# Patient Record
Sex: Female | Born: 1955 | Race: White | Hispanic: No | Marital: Married | Smoking: Never smoker
Health system: Southern US, Community
[De-identification: ages and names within clinical notes are randomized; demographics above are authoritative.]

## PROBLEM LIST (undated history)

## (undated) DIAGNOSIS — C801 Malignant (primary) neoplasm, unspecified: Secondary | ICD-10-CM

## (undated) DIAGNOSIS — J45909 Unspecified asthma, uncomplicated: Secondary | ICD-10-CM

## (undated) HISTORY — PX: ABDOMINAL HYSTERECTOMY: SHX81

## (undated) HISTORY — PX: LUNG LOBECTOMY: SHX167

---

## 2008-02-27 DIAGNOSIS — C801 Malignant (primary) neoplasm, unspecified: Secondary | ICD-10-CM

## 2008-02-27 HISTORY — DX: Malignant (primary) neoplasm, unspecified: C80.1

## 2013-12-15 ENCOUNTER — Emergency Department: Payer: Self-pay | Admitting: Emergency Medicine

## 2015-06-19 ENCOUNTER — Ambulatory Visit
Admission: EM | Admit: 2015-06-19 | Discharge: 2015-06-19 | Disposition: A | Discharge status: Home / Self Care | Payer: BC Managed Care – PPO | Attending: Family Medicine | Admitting: Family Medicine

## 2015-06-19 ENCOUNTER — Encounter: Payer: Self-pay | Admitting: *Deleted

## 2015-06-19 DIAGNOSIS — H05011 Cellulitis of right orbit: Secondary | ICD-10-CM | POA: Diagnosis not present

## 2015-06-19 HISTORY — DX: Unspecified asthma, uncomplicated: J45.909

## 2015-06-19 MED ORDER — CEFUROXIME AXETIL 500 MG PO TABS: 500.0000 mg | ORAL_TABLET | Freq: Two times a day (BID) | ORAL | Status: DC

## 2015-06-19 NOTE — ED Notes (Signed)
Pt awoke this am with right eye pain. Denies injury.

## 2015-06-19 NOTE — Discharge Instructions (Signed)
Orbital Cellulitis Orbital cellulitis is an infection in the eye socket (orbit) and the tissues that surround the eye. The infection can spread to the eyelids, eyebrow area, and cheek. It can also cause a pocket of pus to develop around the eye (orbital abscess). In severe cases, the infection can spread to the brain. Orbital cellulitis is a medical emergency. CAUSES The most common cause of this condition is a bacterial infection. The infection usually spreads to the eye socket from another part of the body. The infection may start in:  The nose or sinuses.  The eyelids.  Facial skin.  The bloodstream. RISK FACTORS This condition is more likely to develop in people who have recently had one of the following:  Upper respiratory infection.  Sinus infection.  Eyelid or facial infection.  Eye injury.  Infection that affects the entire body or the bloodstream (systemic infection). SYMPTOMS Symptoms of this condition usually start quickly. Symptoms include:  Eye pain that gets worse with eye movement.  Swelling around the eye.  Eye redness.  Bulging of the eye.  Inability to move the eye.  Double vision.  Fever. DIAGNOSIS This condition may be diagnosed based on your symptoms and an eye exam. You may also have tests to confirm the diagnosis and to check for an orbital abscess. Other tests (cultures) may be done to find out what type of bacteria is causing the infection. Tests may include:  Complete blood count (CBC).  Blood culture.  Nose, sinus, or throat culture.  Imaging studies such as a CT scan or MRI. TREATMENT This condition is usually treated in a hospital. Antibiotic medicines are given directly into a vein through an IV tube.  At first, you may get IV antibiotics to kill bacteria that often cause orbital cellulitis (broad spectrum antibiotics).  Your medicine may be changed if cultures suggest that another antibiotic would be better.  If the IV  antibiotics are working to treat your infection, you may be switched to oral antibiotics and allowed to go home.  In some cases, surgery may be needed to drain an orbital abscess. HOME CARE INSTRUCTIONS  Take medicines only as directed by your health care provider.  Take your antibiotic medicine as directed by your health care provider. Finish the antibiotic even if you start to feel better.  Return to your normal activities as directed by your health care provider. Ask your health care provider what activities are safe for you.  Keep all follow-up visits as directed by your health care provider. This is important. SEEK IMMEDIATE MEDICAL CARE IF:  Your eye pain or swelling returns or it gets worse.  You have any changes in your vision.  You have a fever.   This information is not intended to replace advice given to you by your health care provider. Make sure you discuss any questions you have with your health care provider.   Document Released: 02/06/2001 Document Revised: 06/29/2014 Document Reviewed: 02/08/2014 Elsevier Interactive Patient Education 2016 [employer]  

## 2015-06-19 NOTE — ED Provider Notes (Signed)
CSN: [id number]     Arrival date & time 06/19/15  1216 History   First MD Initiated Contact with Patient 06/19/15 1408    Nurses notes were reviewed. Chief Complaint  Patient presents with  . Eye Pain  . Headache   Patient reports having pain over right eye. Pain the right eye started today. She states about 3:00 this morning still having pain in the right eye. Sensation unable to close her eyes closed which does close her eyes that hurts. The some discomfort with the right eye but hurts worse which does close her eyes. She reports some nasal congestion but not much. No sore throat. She states she's been on a lot of antibiotics since November the last one was for UTI which she finished about 3 weeks ago which was for Septra. She's not any chronic medications other than as medication and she uses Flexeril at night. Drug allergies she's allergic to penicillins. She's never smoked but she still developed lung cancer. Only family medical history this pertinent is that there is cancer in multiple members of the family. He denies any fever last night or today. Visual acuity was checked by the nurse earlier and found to be normal.    (Consider location/radiation/quality/duration/timing/severity/associated sxs/prior Treatment) Patient is a 60 y.o. female presenting with eye pain and headaches. No language interpreter was used.  Eye Pain This is a new problem. The current episode started 6 to 12 hours ago. Associated symptoms include headaches. Nothing aggravates the symptoms. Nothing relieves the symptoms. She has tried nothing for the symptoms. The treatment provided no relief.  Headache Associated symptoms: eye pain     Past Medical History  Diagnosis Date  . Asthma    Past Surgical History  Procedure Laterality Date  . Lung lobectomy Right    History reviewed. No pertinent family history. Social History  Substance Use Topics  . Smoking status: Never Smoker   . Smokeless tobacco: None  .  Alcohol Use: Yes   OB History    No data available     Review of Systems  Eyes: Positive for pain.  Neurological: Positive for headaches.    Allergies  Penicillins  Home Medications   Prior to Admission medications   Medication Sig Start Date End Date Taking? Authorizing Provider  cyclobenzaprine (FLEXERIL) 10 MG tablet Take 10 mg by mouth 3 (three) times daily as needed for muscle spasms.   Yes Historical Provider, MD  Fluticasone-Salmeterol (ADVAIR) 500-50 MCG/DOSE AEPB Inhale 1 puff into the lungs 2 (two) times daily.   Yes Historical Provider, MD  montelukast (SINGULAIR) 10 MG tablet Take 10 mg by mouth at bedtime.   Yes Historical Provider, MD  cefUROXime (CEFTIN) 500 MG tablet Take 1 tablet (500 mg total) by mouth 2 (two) times daily. 06/19/15   Frederich Cha, MD   Meds Ordered and Administered this Visit  Medications - No data to display  BP 146/81 mmHg  Pulse 88  Temp(Src) 98.1 F (36.7 C) (Oral)  Resp 16  Ht 5\' 4"  (1.626 m)  Wt 210 lb (95.255 kg)  BMI 36.03 kg/m2  SpO2 98% No data found.   Physical Exam  Constitutional: She is oriented to person, place, and time. She appears well-developed and well-nourished.  HENT:  Head: Normocephalic and atraumatic.  Right Ear: Hearing, tympanic membrane, external ear and ear canal normal.  Left Ear: Hearing, tympanic membrane, external ear and ear canal normal.  Nose: Mucosal edema present. No rhinorrhea. Right sinus exhibits maxillary sinus  tenderness. Right sinus exhibits no frontal sinus tenderness. Left sinus exhibits no maxillary sinus tenderness and no frontal sinus tenderness.  Mouth/Throat: Uvula is midline and mucous membranes are normal.  Eyes: Conjunctivae are normal. Pupils are equal, round, and reactive to light.    Neck: Normal range of motion. Neck supple.  Neurological: She is alert and oriented to person, place, and time. She has normal reflexes.  Skin: There is erythema.  Psychiatric: She has a normal  mood and affect. Her behavior is normal.  Vitals reviewed.   ED Course  Procedures (including critical care time)  Labs Review Labs Reviewed - No data to display  Imaging Review No results found.   Visual Acuity Review  Right Eye Distance:   Left Eye Distance:   Bilateral Distance:    Right Eye Near:   Left Eye Near:    Bilateral Near:         MDM   1. Cellulitis of right orbital region    We'll place patient on Ceftin 500 mg 1 tablet twice a day. I informed patient the best I can come with a diagnosis which tried to answer her questions about is she has some type of orbital cellulitis. It is somewhat atypical but the eye movement themselves and I exam appears to be normal. I have asked though that she sees her PCP in 48 hours if not having marked improvement. Also stressed to her that I gets worse before 48 hours to go on to the emergency room to be seen and evaluated this may have some type of imaging studies such as CT scan done.  Note: This dictation was prepared with Dragon dictation along with smaller phrase technology. Any transcriptional errors that result from this process are unintentional.     Frederich Cha, MD 06/19/15 1452

## 2016-08-30 ENCOUNTER — Other Ambulatory Visit: Payer: Self-pay | Admitting: Family Medicine

## 2016-08-30 DIAGNOSIS — Z1239 Encounter for other screening for malignant neoplasm of breast: Secondary | ICD-10-CM

## 2016-10-02 ENCOUNTER — Ambulatory Visit
Admission: RE | Admit: 2016-10-02 | Discharge: 2016-10-02 | Disposition: A | Discharge status: Home / Self Care | Payer: BC Managed Care – PPO | Source: Ambulatory Visit | Attending: Family Medicine | Admitting: Family Medicine

## 2016-10-02 ENCOUNTER — Encounter (INDEPENDENT_AMBULATORY_CARE_PROVIDER_SITE_OTHER): Payer: Self-pay

## 2016-10-02 DIAGNOSIS — Z1231 Encounter for screening mammogram for malignant neoplasm of breast: Secondary | ICD-10-CM | POA: Diagnosis not present

## 2016-10-02 DIAGNOSIS — Z1239 Encounter for other screening for malignant neoplasm of breast: Secondary | ICD-10-CM

## 2016-10-02 HISTORY — DX: Malignant (primary) neoplasm, unspecified: C80.1

## 2016-10-09 ENCOUNTER — Inpatient Hospital Stay
Admission: RE | Admit: 2016-10-09 | Discharge: 2016-10-09 | Disposition: A | Discharge status: Home / Self Care | Payer: Self-pay | Source: Ambulatory Visit | Attending: *Deleted | Admitting: *Deleted

## 2016-10-09 ENCOUNTER — Other Ambulatory Visit: Payer: Self-pay | Admitting: *Deleted

## 2016-10-09 DIAGNOSIS — Z9289 Personal history of other medical treatment: Secondary | ICD-10-CM

## 2018-04-04 ENCOUNTER — Emergency Department
Admission: EM | Admit: 2018-04-04 | Discharge: 2018-04-04 | Disposition: A | Discharge status: Home / Self Care | Payer: BC Managed Care – PPO | Attending: Emergency Medicine | Admitting: Emergency Medicine

## 2018-04-04 ENCOUNTER — Emergency Department: Payer: BC Managed Care – PPO

## 2018-04-04 ENCOUNTER — Other Ambulatory Visit: Payer: Self-pay

## 2018-04-04 DIAGNOSIS — Z85118 Personal history of other malignant neoplasm of bronchus and lung: Secondary | ICD-10-CM | POA: Insufficient documentation

## 2018-04-04 DIAGNOSIS — Z79899 Other long term (current) drug therapy: Secondary | ICD-10-CM | POA: Diagnosis not present

## 2018-04-04 DIAGNOSIS — R509 Fever, unspecified: Secondary | ICD-10-CM | POA: Diagnosis present

## 2018-04-04 DIAGNOSIS — J189 Pneumonia, unspecified organism: Secondary | ICD-10-CM

## 2018-04-04 DIAGNOSIS — J101 Influenza due to other identified influenza virus with other respiratory manifestations: Secondary | ICD-10-CM | POA: Insufficient documentation

## 2018-04-04 DIAGNOSIS — J45909 Unspecified asthma, uncomplicated: Secondary | ICD-10-CM | POA: Insufficient documentation

## 2018-04-04 DIAGNOSIS — R2242 Localized swelling, mass and lump, left lower limb: Secondary | ICD-10-CM | POA: Diagnosis not present

## 2018-04-04 LAB — CBC
HCT: 46.1 % — ABNORMAL HIGH (ref 36.0–46.0)
HEMOGLOBIN: 14.8 g/dL (ref 12.0–15.0)
MCH: 28.8 pg (ref 26.0–34.0)
MCHC: 32.1 g/dL (ref 30.0–36.0)
MCV: 89.7 fL (ref 80.0–100.0)
Platelets: 201 10*3/uL (ref 150–400)
RBC: 5.14 MIL/uL — ABNORMAL HIGH (ref 3.87–5.11)
RDW: 13.1 % (ref 11.5–15.5)
WBC: 14.9 10*3/uL — ABNORMAL HIGH (ref 4.0–10.5)
nRBC: 0 % (ref 0.0–0.2)

## 2018-04-04 LAB — BASIC METABOLIC PANEL
Anion gap: 8 (ref 5–15)
BUN: 21 mg/dL (ref 8–23)
CALCIUM: 9 mg/dL (ref 8.9–10.3)
CHLORIDE: 104 mmol/L (ref 98–111)
CO2: 24 mmol/L (ref 22–32)
Creatinine, Ser: 0.57 mg/dL (ref 0.44–1.00)
GFR calc Af Amer: >60 mL/min (ref >60)
Glucose, Bld: 121 mg/dL — ABNORMAL HIGH (ref 70–99)
Potassium: 4.1 mmol/L (ref 3.5–5.1)
SODIUM: 136 mmol/L (ref 135–145)

## 2018-04-04 LAB — URINALYSIS, ROUTINE W REFLEX MICROSCOPIC
Bacteria, UA: NONE SEEN
Bilirubin Urine: NEGATIVE
Glucose, UA: NEGATIVE mg/dL
Hgb urine dipstick: NEGATIVE
Ketones, ur: NEGATIVE mg/dL
Nitrite: NEGATIVE
Protein, ur: NEGATIVE mg/dL
SPECIFIC GRAVITY, URINE: 1.019 (ref 1.005–1.030)
pH: 5 (ref 5.0–8.0)

## 2018-04-04 LAB — LACTIC ACID, PLASMA: Lactic Acid, Venous: 1.3 mmol/L (ref 0.5–1.9)

## 2018-04-04 LAB — INFLUENZA PANEL BY PCR (TYPE A & B)
INFLAPCR: POSITIVE — AB
INFLBPCR: NEGATIVE

## 2018-04-04 MED ORDER — ACETAMINOPHEN 325 MG PO TABS
650.0000 mg | ORAL_TABLET | Freq: Once | ORAL | Status: AC | PRN
  Administered 2018-04-04: 650 mg via ORAL
  Filled 2018-04-04: qty 2

## 2018-04-04 MED ORDER — LEVOFLOXACIN IN D5W 750 MG/150ML IV SOLN
750.0000 mg | Freq: Once | INTRAVENOUS | Status: AC
  Administered 2018-04-04: 750 mg via INTRAVENOUS
  Filled 2018-04-04: qty 150

## 2018-04-04 MED ORDER — OSELTAMIVIR PHOSPHATE 75 MG PO CAPS: 75.0000 mg | ORAL_CAPSULE | Freq: Two times a day (BID) | ORAL | 0 refills | Status: AC

## 2018-04-04 MED ORDER — LEVOFLOXACIN 750 MG PO TABS: 750.0000 mg | ORAL_TABLET | Freq: Every day | ORAL | 0 refills | Status: AC

## 2018-04-04 NOTE — ED Notes (Signed)
Given mask

## 2018-04-04 NOTE — ED Triage Notes (Signed)
C/o fever, cough that began last night. Reports productive cough. Body aches. No OTC medications for fever today.

## 2018-04-04 NOTE — ED Provider Notes (Signed)
[facility] Emergency Department Provider Note  ____________________________________________   First MD Initiated Contact with Patient 04/04/18 1958     (approximate)  I have reviewed the triage vital signs and the nursing notes.   HISTORY  Chief Complaint Fever and Cough   HPI Lindsay Wiggins is a 63 y.o. female with a history of lung cancer status post right sided lung resection who is presented emergency department with 24 hours of cough as well as fever.  Also complaining of generalized weakness/malaise.  Denies body aches.  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  Denies any shortness of breath or chest pain.  Also complaining of left lower extremity swelling.  Says that she injured this knee about 2 months ago in her Zumba class and had injection done several days ago.  Says that the pain is now worse in the left knee and she has edema about the left ankle which is not normal for her.   Past Medical History:  Diagnosis Date  . Asthma   . Cancer ([place]) 2010   lung ca- most of rt lung resected    There are no active problems to display for this patient.   Past Surgical History:  Procedure Laterality Date  . ABDOMINAL HYSTERECTOMY    . LUNG LOBECTOMY Right     Prior to Admission medications   Medication Sig Start Date End Date Taking? Authorizing Provider  cefUROXime (CEFTIN) 500 MG tablet Take 1 tablet (500 mg total) by mouth 2 (two) times daily. 06/19/15   Frederich Cha, MD  cyclobenzaprine (FLEXERIL) 10 MG tablet Take 10 mg by mouth 3 (three) times daily as needed for muscle spasms.    [provider]  Fluticasone-Salmeterol (ADVAIR) 500-50 MCG/DOSE AEPB Inhale 1 puff into the lungs 2 (two) times daily.    [provider]  montelukast (SINGULAIR) 10 MG tablet Take 10 mg by mouth at bedtime.    [provider]     Allergies Penicillins  Family History  Problem Relation Age of Onset  . Breast cancer Neg Hx     Social History Social History   Tobacco Use  . Smoking status: Never Smoker  Substance Use Topics  . Alcohol use: Yes  . Drug use: Not on file    Review of Systems  Constitutional: As above Eyes: No visual changes. ENT: No sore throat.  Denies runny nose. Cardiovascular: Denies chest pain. Respiratory: As above Gastrointestinal: No abdominal pain.  No nausea, no vomiting.  No diarrhea.  No constipation. Genitourinary: Negative for dysuria. Musculoskeletal: Negative for back pain. Skin: Negative for rash. Neurological: Negative for headaches, focal weakness or numbness.   ____________________________________________   PHYSICAL EXAM:  VITAL SIGNS: ED Triage Vitals [04/04/18 1719]  Enc Vitals Group     BP 139/70     Pulse Rate (!) 116     Resp 18     Temp (!) 102.6 F (39.2 C)     Temp Source Oral     SpO2 94 %     Weight 208 lb (94.3 kg)     Height 5\' 4"  (1.626 m)     Head Circumference      Peak Flow      Pain Score 2     Pain Loc      Pain Edu?      Excl. in [place]?     Constitutional: Alert and  oriented. Well appearing and in no acute distress. Eyes: Conjunctivae are normal.  Head: Atraumatic. Nose: No congestion/rhinnorhea. Mouth/Throat: Mucous membranes are moist.  Neck: No stridor.   Cardiovascular: Normal rate, regular rhythm. Grossly normal heart sounds.  Respiratory: Normal respiratory effort.  No retractions.  Mild rales to left lower field. Gastrointestinal: Soft and nontender. No distention. No CVA tenderness. Musculoskeletal: Left lower extremity with edema from the left ankle to left mid calf.  No effusion to left knee.  No erythema or fluctuance.  No ligamentous laxity.   Neurologic:  Normal speech and language. No gross focal neurologic deficits are appreciated. Skin:  Skin is warm, dry and intact. No rash noted. Psychiatric: Mood and  affect are normal. Speech and behavior are normal.  ____________________________________________   LABS (all labs ordered are listed, but only abnormal results are displayed)  Labs Reviewed  CBC - Abnormal; Notable for the following components:      Result Value   WBC 14.9 (*)    RBC 5.14 (*)    HCT 46.1 (*)    All other components within normal limits  BASIC METABOLIC PANEL - Abnormal; Notable for the following components:   Glucose, Bld 121 (*)    All other components within normal limits  URINALYSIS, ROUTINE W REFLEX MICROSCOPIC - Abnormal; Notable for the following components:   Color, Urine YELLOW (*)    APPearance CLEAR (*)    Leukocytes, UA SMALL (*)    All other components within normal limits  INFLUENZA PANEL BY PCR (TYPE A & B) - Abnormal; Notable for the following components:   Influenza A By PCR POSITIVE (*)    All other components within normal limits  CULTURE, BLOOD (ROUTINE X 2)  CULTURE, BLOOD (ROUTINE X 2)  LACTIC ACID, PLASMA   ____________________________________________  EKG  ED ECG REPORT I, Doran Stabler, the attending physician, personally viewed and interpreted this ECG.   Date: 04/04/2018  EKG Time: 2020  Rate: 102  Rhythm: sinus tachycardia  Axis: Normal  Intervals:none  ST&T Change: No ST segment ovation or depression.  No abnormal T wave inversion.  ____________________________________________  RADIOLOGY  Left lower lobe atalectasis vs pneumonia.  Negative DVT ultrasound of the left lower extremity. ____________________________________________   PROCEDURES  Procedure(s) performed:   Procedures  Critical Care performed:   ____________________________________________   INITIAL IMPRESSION / ASSESSMENT AND PLAN / ED COURSE  Pertinent labs & imaging results that were available during my care of the patient were reviewed by me and considered in my medical decision making (see chart for details).  Differential includes, but  is not limited to, viral syndrome, bronchitis including COPD exacerbation, pneumonia, reactive airway disease including asthma, CHF including exacerbation with or without pulmonary/interstitial edema, pneumothorax, ACS, thoracic trauma, and pulmonary embolism. As part of my medical decision making, I reviewed the following data within the electronic MEDICAL RECORD NUMBER Notes from prior ED visits  ----------------------------------------- 10:43 PM on 04/04/2018 -----------------------------------------  Patient flu a positive.  Possible left lower lobe pneumonia.  Given a dose of Levaquin in the emergency department.  I discussed Tamiflu with the patient and she would like to defer as she thinks that the side effects may outweigh the benefits.  Has the evidence is mixed with Tamiflu we had a discussion about the benefits versus the side effects.  The patient says that she will except prescription for Tamiflu and depending on how she feels tomorrow may start the medication but knows to start it within 48  hours of symptom onset.  Says that the symptoms started at 2 AM this morning.  Also knows to return to the hospital for any worsening or concerning symptoms.  She will be discharged home with Levaquin.  Normal lactic acid.  Afebrile at this time with a heart rate of 94.  Benign appearing.  We also discussed that the most serious complications from flu may be overlying pneumonia and that people do die from this.  She says that she would not like to stay in the hospital and that she return for any worsening concerning symptoms.  Patient not altered at this time.  Not clinically intoxicated.  Has capacity to make medical decisions.  Will be discharged home. ____________________________________________   FINAL CLINICAL IMPRESSION(S) / ED DIAGNOSES  Influenza A.  Community-acquired pneumonia.  NEW MEDICATIONS STARTED DURING THIS VISIT:  New Prescriptions   No medications on file     Note:  This document  was prepared using Dragon voice recognition software and may include unintentional dictation errors.     Orbie Pyo, MD 04/04/18 2245

## 2018-04-04 NOTE — Progress Notes (Signed)
CODE SEPSIS - PHARMACY COMMUNICATION  **Broad Spectrum Antibiotics should be administered within 1 hour of Sepsis diagnosis**  Time Code Sepsis Called/Page Received: 2005  Antibiotics Ordered: none yet- suspected sepsis order  Time of 1st antibiotic administration:    Additional action taken by pharmacy: Called RN Terri Piedra- she will ask MD what the plan is for abx  If necessary, Name of Provider/Nurse Contacted: Veverly Fells ,PharmD Clinical Pharmacist  04/04/2018  9:03 PM

## 2018-04-04 NOTE — ED Notes (Signed)
Patient transported to Ultrasound 

## 2018-04-04 NOTE — Progress Notes (Addendum)
CODE SEPSIS - PHARMACY COMMUNICATION  **Broad Spectrum Antibiotics should be administered within 1 hour of Sepsis diagnosis**  Time Code Sepsis Called/Page Received: 2005  Antibiotics Ordered: Levaquin now order -see previous note about code sepsis  Time of 1st antibiotic administration:  @ 2110  Additional action taken by pharmacy: [place]- she will ask MD what the plan is for abx  If necessary, Name of Provider/Nurse Contacted: Glennie Isle, PharmD, BCPS Clinical Pharmacist 04/04/2018 9:31 PM

## 2018-04-09 LAB — CULTURE, BLOOD (ROUTINE X 2)
Culture: NO GROWTH
Culture: NO GROWTH

## 2019-05-02 ENCOUNTER — Ambulatory Visit: Payer: BC Managed Care – PPO

## 2019-05-02 ENCOUNTER — Ambulatory Visit: Payer: BC Managed Care – PPO | Attending: Internal Medicine

## 2019-05-02 DIAGNOSIS — Z23 Encounter for immunization: Secondary | ICD-10-CM

## 2019-05-02 NOTE — Progress Notes (Signed)
   Covid-19 Vaccination Clinic  Name:  Lindsay Wiggins    MRN: [id number] DOB: 24-Dec-1955  05/02/2019  [name] was observed post Covid-19 immunization for 30 minutes based on pre-vaccination screening without incident. She was provided with Vaccine Information Sheet and instruction to access the V-Safe system.   [name] was instructed to call 911 with any severe reactions post vaccine: Marland Kitchen Difficulty breathing  . Swelling of face and throat  . A fast heartbeat  . A bad rash all over body  . Dizziness and weakness   Immunizations Administered    Name Date Dose VIS Date Route   Pfizer COVID-19 Vaccine 05/02/2019 12:18 PM 0.3 mL 02/06/2019 Intramuscular   Manufacturer: [place]   Lot: [id number]   [place]: [id number]

## 2019-05-09 ENCOUNTER — Other Ambulatory Visit: Payer: Self-pay

## 2019-05-09 ENCOUNTER — Ambulatory Visit
Admission: EM | Admit: 2019-05-09 | Discharge: 2019-05-09 | Disposition: A | Discharge status: Home / Self Care | Payer: BC Managed Care – PPO | Attending: Family Medicine | Admitting: Family Medicine

## 2019-05-09 ENCOUNTER — Encounter: Payer: Self-pay | Admitting: Emergency Medicine

## 2019-05-09 DIAGNOSIS — N390 Urinary tract infection, site not specified: Secondary | ICD-10-CM | POA: Diagnosis not present

## 2019-05-09 LAB — URINALYSIS, COMPLETE (UACMP) WITH MICROSCOPIC
Bacteria, UA: NONE SEEN
Bilirubin Urine: NEGATIVE
Glucose, UA: NEGATIVE mg/dL
Hgb urine dipstick: NEGATIVE
Leukocytes,Ua: NEGATIVE
Nitrite: POSITIVE — AB
Protein, ur: NEGATIVE mg/dL
Specific Gravity, Urine: 1.025 (ref 1.005–1.030)
WBC, UA: NONE SEEN WBC/hpf (ref 0–5)
pH: 6 (ref 5.0–8.0)

## 2019-05-09 MED ORDER — SULFAMETHOXAZOLE-TRIMETHOPRIM 800-160 MG PO TABS: 1.0000 | ORAL_TABLET | Freq: Two times a day (BID) | ORAL | 0 refills | Status: DC

## 2019-05-09 NOTE — Discharge Instructions (Signed)
Increase water intake

## 2019-05-09 NOTE — ED Triage Notes (Signed)
Patient c/o mid back pain, burning when urinating and urinary urgency that started Wed.  Patient denies fevers.

## 2019-05-09 NOTE — ED Provider Notes (Signed)
[facility]    CSN: [id number] Arrival date & time: 05/09/19  1304      History   Chief Complaint Chief Complaint  Patient presents with  . Back Pain  . Dysuria    HPI Lindsay Wiggins is a 64 y.o. female.   64 yo female with a c/o burning with urination, frequency and urgency of urination for the past 4 days. Denies any fevers, chills, nausea, vomiting in the last 3 days.    Back Pain Associated symptoms: dysuria   Dysuria   Past Medical History:  Diagnosis Date  . Asthma   . Cancer ([place]) 2010   lung ca- most of rt lung resected    There are no problems to display for this patient.   Past Surgical History:  Procedure Laterality Date  . ABDOMINAL HYSTERECTOMY    . LUNG LOBECTOMY Right     OB History   No obstetric history on file.      Home Medications    Prior to Admission medications   Medication Sig Start Date End Date Taking? Authorizing Provider  cyclobenzaprine (FLEXERIL) 10 MG tablet Take 10 mg by mouth 3 (three) times daily as needed for muscle spasms.   Yes [provider]  Fluticasone-Salmeterol (ADVAIR) 500-50 MCG/DOSE AEPB Inhale 1 puff into the lungs 2 (two) times daily.   Yes [provider]  montelukast (SINGULAIR) 10 MG tablet Take 10 mg by mouth at bedtime.   Yes [provider]  cefUROXime (CEFTIN) 500 MG tablet Take 1 tablet (500 mg total) by mouth 2 (two) times daily. 06/19/15   Frederich Cha, MD  sulfamethoxazole-trimethoprim (BACTRIM DS) 800-160 MG tablet Take 1 tablet by mouth 2 (two) times daily. 05/09/19   Norval Gable, MD    Family History Family History  Problem Relation Age of Onset  . Breast cancer Neg Hx     Social History Social History   Tobacco Use  . Smoking status: Never Smoker  . Smokeless tobacco: Never Used  Substance Use Topics  . Alcohol use: Yes  . Drug use: Not on file     Allergies   Penicillins   Review of Systems Review of Systems  Genitourinary:  Positive for dysuria.  Musculoskeletal: Positive for back pain.     Physical Exam Triage Vital Signs ED Triage Vitals  Enc Vitals Group     BP 05/09/19 1326 133/72     Pulse Rate 05/09/19 1326 88     Resp 05/09/19 1326 16     Temp 05/09/19 1326 97.8 F (36.6 C)     Temp Source 05/09/19 1326 Oral     SpO2 05/09/19 1326 98 %     Weight 05/09/19 1321 210 lb (95.3 kg)     Height 05/09/19 1321 5\' 4"  (1.626 m)     Head Circumference --      Peak Flow --      Pain Score 05/09/19 1321 7     Pain Loc --      Pain Edu? --      Excl. in [place]? --    No data found.  Updated Vital Signs BP 133/72 (BP Location: Right Arm)   Pulse 88   Temp 97.8 F (36.6 C) (Oral)   Resp 16   Ht 5\' 4"  (1.626 m)   Wt 95.3 kg   SpO2 98%   BMI 36.05 kg/m   Visual Acuity Right Eye Distance:   Left Eye Distance:   Bilateral Distance:  Right Eye Near:   Left Eye Near:    Bilateral Near:     Physical Exam Vitals and nursing note reviewed.  Constitutional:      General: She is not in acute distress.    Appearance: She is not toxic-appearing or diaphoretic.  Cardiovascular:     Rate and Rhythm: Normal rate.  Pulmonary:     Effort: Pulmonary effort is normal. No respiratory distress.  Abdominal:     General: There is no distension.     Palpations: Abdomen is soft.     Tenderness: There is no right CVA tenderness or left CVA tenderness.  Neurological:     Mental Status: She is alert.      UC Treatments / Results  Labs (all labs ordered are listed, but only abnormal results are displayed) Labs Reviewed  URINALYSIS, COMPLETE (UACMP) WITH MICROSCOPIC - Abnormal; Notable for the following components:      Result Value   Ketones, ur TRACE (*)    Nitrite POSITIVE (*)    All other components within normal limits  URINE CULTURE    EKG   Radiology No results found.  Procedures Procedures (including critical care time)  Medications Ordered in UC Medications - No data to  display  Initial Impression / Assessment and Plan / UC Course  I have reviewed the triage vital signs and the nursing notes.  Pertinent labs & imaging results that were available during my care of the patient were reviewed by me and considered in my medical decision making (see chart for details).      Final Clinical Impressions(s) / UC Diagnoses   Final diagnoses:  Lower urinary tract infectious disease     Discharge Instructions     Increase water intake    ED Prescriptions    Medication Sig Dispense Auth. Provider   sulfamethoxazole-trimethoprim (BACTRIM DS) 800-160 MG tablet Take 1 tablet by mouth 2 (two) times daily. 10 tablet Norval Gable, MD      1. Lab results and diagnosis reviewed with patient 2. rx as per orders above; reviewed possible side effects, interactions, risks and benefits  3. Recommend supportive treatment as above 4. Follow-up prn if symptoms worsen or don't improve   PDMP not reviewed this encounter.   Norval Gable, MD 05/09/19 1419

## 2019-05-11 LAB — URINE CULTURE

## 2019-05-23 ENCOUNTER — Ambulatory Visit: Payer: BC Managed Care – PPO | Attending: Internal Medicine

## 2019-05-23 DIAGNOSIS — Z23 Encounter for immunization: Secondary | ICD-10-CM

## 2019-05-23 NOTE — Progress Notes (Signed)
   Covid-19 Vaccination Clinic   Mrs Ahlgrim rec'd her 2nd dose of the COVID vaccine and called RN over during her observation period d/t feeling dizzy at 1228.  [place] to pt. She is dizzy, c/o feeling hot, but extremities are cool, she is pale. Fan started. 96% RA, RR 18, P 87, 141/73 (pt and husband report this is high for her). She brought water with her; encouraged her to keep drinking. She denies hx of DM or HTN, but she does have a hx of asthma. She ate breakfast but has not eaten lunch. Offered crackers, but she does not eat gluten, and we have no gluten-free snacks. She reports she felt "bad" for a few weeks after her first dose and only started feeling well a few days ago.  1238 Mrs Boot reports feeling back to baseline. 98% on RA, 103/69 (normal for pt per pt & husband), P 85, RR16. P 85. She feels she will be ready to leave when her time is up, but she does not feel steady enough to give up the fan.  1250 Observation period up. Patient is ready to leave and feels comfortable and back to baseline. Left on foot with husband.  Marjie Skiff Rosaly Labarbera, RN, BSN, [facility]  05/23/2019 1:12 PM

## 2020-02-08 ENCOUNTER — Other Ambulatory Visit: Payer: Self-pay | Admitting: Family Medicine

## 2020-02-08 DIAGNOSIS — Z1231 Encounter for screening mammogram for malignant neoplasm of breast: Secondary | ICD-10-CM

## 2020-03-15 ENCOUNTER — Other Ambulatory Visit: Payer: Self-pay

## 2020-03-15 ENCOUNTER — Ambulatory Visit
Admission: RE | Admit: 2020-03-15 | Discharge: 2020-03-15 | Disposition: A | Discharge status: Home / Self Care | Payer: Medicare PPO | Source: Ambulatory Visit | Attending: Family Medicine | Admitting: Family Medicine

## 2020-03-15 DIAGNOSIS — Z1231 Encounter for screening mammogram for malignant neoplasm of breast: Secondary | ICD-10-CM | POA: Diagnosis present

## 2020-03-16 LAB — EXTERNAL GENERIC LAB PROCEDURE: COLOGUARD: NEGATIVE

## 2020-03-16 LAB — COLOGUARD: COLOGUARD: NEGATIVE

## 2020-05-02 ENCOUNTER — Ambulatory Visit
Admission: EM | Admit: 2020-05-02 | Discharge: 2020-05-02 | Disposition: A | Discharge status: Home / Self Care | Payer: Medicare PPO

## 2020-05-02 ENCOUNTER — Other Ambulatory Visit: Payer: Self-pay

## 2020-05-02 DIAGNOSIS — J4521 Mild intermittent asthma with (acute) exacerbation: Secondary | ICD-10-CM | POA: Diagnosis not present

## 2020-05-02 MED ORDER — PREDNISONE 20 MG PO TABS: 60.0000 mg | ORAL_TABLET | Freq: Every day | ORAL | 0 refills | Status: AC

## 2020-05-02 MED ORDER — AEROCHAMBER MV MISC: 2 refills | Status: AC

## 2020-05-02 NOTE — ED Provider Notes (Signed)
[facility]    CSN: [id number] Arrival date & time: 05/02/20  0906      History   Chief Complaint Chief Complaint  Patient presents with  . Shortness of Breath    HPI Lindsay Wiggins is a 65 y.o. female.   HPI   65 year old female here for evaluation of shortness of breath.  Patient reports that her shortness of breath started around 4 AM this morning.  She used both her inhaler, without a spacer, and then switch to her nebulizer when she was not getting any relief from her shortness of breath.  Patient states that she has had an intermittent cough that is occasionally productive for a white sputum.  Patient denies fever, wheezing, runny nose, nasal congestion, or GI complaints.  Patient has not been around anybody who has been sick recently.  Patient does have a history of lung cancer and has had a partial pneumonectomy on the lower right.  Past Medical History:  Diagnosis Date  . Asthma   . Cancer ([place]) 2010   lung ca- most of rt lung resected    There are no problems to display for this patient.   Past Surgical History:  Procedure Laterality Date  . ABDOMINAL HYSTERECTOMY    . LUNG LOBECTOMY Right     OB History   No obstetric history on file.      Home Medications    Prior to Admission medications   Medication Sig Start Date End Date Taking? Authorizing Provider  cyclobenzaprine (FLEXERIL) 10 MG tablet Take 10 mg by mouth 3 (three) times daily as needed for muscle spasms.   Yes [provider]  Fluticasone-Salmeterol (WIXELA INHUB) 500-50 MCG/DOSE AEPB Inhale 1 puff into the lungs 2 (two) times daily.   Yes [provider]  montelukast (SINGULAIR) 10 MG tablet Take 10 mg by mouth at bedtime.   Yes [provider]  predniSONE (DELTASONE) 20 MG tablet Take 3 tablets (60 mg total) by mouth daily with breakfast for 5 days. 3 tablets by mouth daily for 5 days. 05/02/20 05/07/20 Yes Margarette Canada, NP  Spacer/Aero-Holding Josiah Lobo  (AEROCHAMBER MV) inhaler Use as instructed 05/02/20  Yes Margarette Canada, NP  albuterol (VENTOLIN HFA) 108 (90 Base) MCG/ACT inhaler SMARTSIG:2 Inhalation Via Inhaler Every 4 Hours PRN 03/22/20   [provider]    Family History Family History  Problem Relation Age of Onset  . Breast cancer Neg Hx     Social History Social History   Tobacco Use  . Smoking status: Never Smoker  . Smokeless tobacco: Never Used  Vaping Use  . Vaping Use: Never used  Substance Use Topics  . Alcohol use: Yes     Allergies   Penicillins   Review of Systems Review of Systems  Constitutional: Negative for activity change, appetite change and fever.  HENT: Negative for congestion and rhinorrhea.   Respiratory: Positive for cough and shortness of breath.   Gastrointestinal: Negative for diarrhea, nausea and vomiting.  Musculoskeletal: Negative for arthralgias and myalgias.  Hematological: Negative.   Psychiatric/Behavioral: Negative.      Physical Exam Triage Vital Signs ED Triage Vitals [05/02/20 0928]  Enc Vitals Group     BP      Pulse      Resp      Temp      Temp src      SpO2      Weight      Height      Head Circumference  Peak Flow      Pain Score 0     Pain Loc      Pain Edu?      Excl. in [place]?    No data found.  Updated Vital Signs BP (!) 131/51 (BP Location: Left Arm)   Pulse 92   Temp 98.3 F (36.8 C) (Oral)   Resp (!) 21   Ht 5\' 3"  (1.6 m)   Wt 240 lb (108.9 kg)   SpO2 100%   BMI 42.51 kg/m   Visual Acuity Right Eye Distance:   Left Eye Distance:   Bilateral Distance:    Right Eye Near:   Left Eye Near:    Bilateral Near:     Physical Exam Vitals and nursing note reviewed.  Constitutional:      General: She is not in acute distress.    Appearance: She is well-developed. She is obese. She is not ill-appearing.  HENT:     Head: Normocephalic and atraumatic.     Mouth/Throat:     Mouth: Mucous membranes are moist.  Eyes:     Extraocular  Movements: Extraocular movements intact.     Pupils: Pupils are equal, round, and reactive to light.  Cardiovascular:     Rate and Rhythm: Normal rate.     Heart sounds: Normal heart sounds. No murmur heard. No friction rub.  Pulmonary:     Effort: Pulmonary effort is normal. No tachypnea or respiratory distress.     Breath sounds: No stridor. Examination of the right-upper field reveals decreased breath sounds. Examination of the right-middle field reveals decreased breath sounds. Examination of the left-middle field reveals decreased breath sounds. Examination of the left-lower field reveals decreased breath sounds. Decreased breath sounds present. No wheezing, rhonchi or rales.  Skin:    General: Skin is warm and dry.     Capillary Refill: Capillary refill takes less than 2 seconds.     Findings: No erythema.  Neurological:     General: No focal deficit present.     Mental Status: She is alert and oriented to person, place, and time.  Psychiatric:        Mood and Affect: Mood normal.        Behavior: Behavior normal.      UC Treatments / Results  Labs (all labs ordered are listed, but only abnormal results are displayed) Labs Reviewed - No data to display  EKG   Radiology No results found.  Procedures Procedures (including critical care time)  Medications Ordered in UC Medications - No data to display  Initial Impression / Assessment and Plan / UC Course  I have reviewed the triage vital signs and the nursing notes.  Pertinent labs & imaging results that were available during my care of the patient were reviewed by me and considered in my medical decision making (see chart for details).   Patient is a very pleasant 65 year old female with a history of lung cancer and asthma that presents for evaluation of shortness of breath.  Patient reports that her shortness of breath started at 4 AM and she treated at home with her inhaler and also with nebulizer.  Patient reports  that those medications did not help her symptoms.  She became concerned because her home pulse ox was reading her oxygen level in the low 90s.  Being at 100% here in clinic and has a respiratory rate of 21 documented.  Patient is in no acute distress on physical exam.  Upper respiratory tree  is benign.  Patient does have mildly decreased lung sounds diffusely without rales or rhonchi.  No wheezes noted.  Patient's physical exam is consistent with an asthma flare.  We will treat patient with scheduled around-the-clock nebulizer treatments and place patient on a 5-day burst dose of prednisone 60 mg.  Patient verbalizes an understanding of same.  ER and return precautions reviewed.   Final Clinical Impressions(s) / UC Diagnoses   Final diagnoses:  Mild intermittent asthma with exacerbation     Discharge Instructions     Use your albuterol nebulizer, or your inhaler with your spacer, every 4-6 hours for the next 24 to 48 hours to help break the airway constriction.  Continue your Wixela as prescribed.  Take the prednisone 60 mg daily with breakfast for the next 5 days to help with inflammation.  If you have a worsening of your shortness of breath, unable to catch her breath, or unable to speak in full sentences, or is a late sign your lip start turning blue you need to go to the ER for evaluation.    ED Prescriptions    Medication Sig Dispense Auth. Provider   predniSONE (DELTASONE) 20 MG tablet Take 3 tablets (60 mg total) by mouth daily with breakfast for 5 days. 3 tablets by mouth daily for 5 days. 15 tablet Margarette Canada, NP   Spacer/Aero-Holding Josiah Lobo (AEROCHAMBER MV) inhaler Use as instructed 1 each Margarette Canada, NP     PDMP not reviewed this encounter.   Margarette Canada, NP 05/02/20 [phone]

## 2020-05-02 NOTE — ED Triage Notes (Signed)
Patient states that she has been having shortness of breath that started around 4 am this morning. States that she is an asthmatic and lung cancer survivor with a history of partial right lung removal. States that this woke her up this morning and 02 sats in low 90's, states that she did use her inhalers and nebulizer.

## 2020-05-02 NOTE — Discharge Instructions (Signed)
Use your albuterol nebulizer, or your inhaler with your spacer, every 4-6 hours for the next 24 to 48 hours to help break the airway constriction.  Continue your Wixela as prescribed.  Take the prednisone 60 mg daily with breakfast for the next 5 days to help with inflammation.  If you have a worsening of your shortness of breath, unable to catch her breath, or unable to speak in full sentences, or is a late sign your lip start turning blue you need to go to the ER for evaluation.

## 2020-06-11 ENCOUNTER — Other Ambulatory Visit: Payer: Self-pay

## 2020-06-11 ENCOUNTER — Emergency Department: Payer: Medicare PPO

## 2020-06-11 ENCOUNTER — Observation Stay
Admission: EM | Admit: 2020-06-11 | Discharge: 2020-06-13 | Disposition: A | Discharge status: Home / Self Care | Payer: Medicare PPO | Attending: Student | Admitting: Student

## 2020-06-11 DIAGNOSIS — J45909 Unspecified asthma, uncomplicated: Secondary | ICD-10-CM | POA: Diagnosis present

## 2020-06-11 DIAGNOSIS — E66813 Obesity, class 3: Secondary | ICD-10-CM | POA: Diagnosis present

## 2020-06-11 DIAGNOSIS — J45901 Unspecified asthma with (acute) exacerbation: Secondary | ICD-10-CM | POA: Diagnosis present

## 2020-06-11 DIAGNOSIS — Z20822 Contact with and (suspected) exposure to covid-19: Secondary | ICD-10-CM | POA: Diagnosis not present

## 2020-06-11 DIAGNOSIS — J4541 Moderate persistent asthma with (acute) exacerbation: Secondary | ICD-10-CM | POA: Diagnosis not present

## 2020-06-11 DIAGNOSIS — R0902 Hypoxemia: Secondary | ICD-10-CM | POA: Diagnosis not present

## 2020-06-11 DIAGNOSIS — R0602 Shortness of breath: Secondary | ICD-10-CM | POA: Diagnosis present

## 2020-06-11 DIAGNOSIS — Z85118 Personal history of other malignant neoplasm of bronchus and lung: Secondary | ICD-10-CM | POA: Diagnosis not present

## 2020-06-11 DIAGNOSIS — R030 Elevated blood-pressure reading, without diagnosis of hypertension: Secondary | ICD-10-CM | POA: Diagnosis present

## 2020-06-11 MED ORDER — METHYLPREDNISOLONE SODIUM SUCC 125 MG IJ SOLR
125.0000 mg | Freq: Once | INTRAMUSCULAR | Status: AC
  Administered 2020-06-12: 125 mg via INTRAVENOUS
  Filled 2020-06-11: qty 2

## 2020-06-11 MED ORDER — IPRATROPIUM-ALBUTEROL 0.5-2.5 (3) MG/3ML IN SOLN
3.0000 mL | Freq: Once | RESPIRATORY_TRACT | Status: AC
  Administered 2020-06-11: 3 mL via RESPIRATORY_TRACT
  Filled 2020-06-11: qty 3

## 2020-06-11 NOTE — ED Triage Notes (Signed)
Pt presents to ER c/o SOB since getting off prednisone taper on Wednesday.  Pt was being treated for possible asthma flare.  Pt tested negative for COVID 4 times recently.  Pt appears winded in triage.  Pt denies fevers and cough.  Pt has hx of asthma and lung ca with partial resection of right lower lung lobe.

## 2020-06-11 NOTE — ED Provider Notes (Signed)
[facility] Emergency Department Provider Note   ____________________________________________   Event Date/Time   First MD Initiated Contact with Patient 06/11/20 2305     (approximate)  I have reviewed the triage vital signs and the nursing notes.   HISTORY  Chief Complaint Shortness of Breath    HPI Lindsay Wiggins is a 65 y.o. female who presents to the ED from home with a chief complaint of shortness of breath.  Patient has a history of lung cancer status post lung resection, asthma who has been short of breath for the past 6 weeks.  Has been treated by her pulmonologist for possible asthma flare.  Recently discontinued prednisone taper 3 days ago.  States she felt fine on prednisone and decided to cancel a CT chest which was ordered for her since she was feeling good.  Since discontinue prednisone, patient is now more short of breath, especially on exertion and associated with chest tightness.  Denies fever, cough, abdominal pain, nausea, vomiting or dizziness.  Patient is vaccinated and boosted against COVID-19.  She has tested negative for COVID 4 times recently.  Denies recent travel or trauma.     Past Medical History:  Diagnosis Date  . Asthma   . Cancer ([place]) 2010   lung ca- most of rt lung resected    Patient Active Problem List   Diagnosis Date Noted  . Asthma exacerbation 06/12/2020    Past Surgical History:  Procedure Laterality Date  . ABDOMINAL HYSTERECTOMY    . LUNG LOBECTOMY Right     Prior to Admission medications   Medication Sig Start Date End Date Taking? Authorizing Provider  albuterol (VENTOLIN HFA) 108 (90 Base) MCG/ACT inhaler SMARTSIG:2 Inhalation Via Inhaler Every 4 Hours PRN 03/22/20   [provider]  cyclobenzaprine (FLEXERIL) 10 MG tablet Take 10 mg by mouth 3 (three) times daily as needed for muscle spasms.    [provider]  Fluticasone-Salmeterol (WIXELA INHUB) 500-50 MCG/DOSE AEPB Inhale 1  puff into the lungs 2 (two) times daily.    [provider]  montelukast (SINGULAIR) 10 MG tablet Take 10 mg by mouth at bedtime.    [provider]  Spacer/Aero-Holding Chambers (AEROCHAMBER MV) inhaler Use as instructed 05/02/20   Margarette Canada, NP    Allergies Penicillins  Family History  Problem Relation Age of Onset  . Breast cancer Neg Hx     Social History Social History   Tobacco Use  . Smoking status: Never Smoker  . Smokeless tobacco: Never Used  Vaping Use  . Vaping Use: Never used  Substance Use Topics  . Alcohol use: Yes    Review of Systems  Constitutional: No fever/chills Eyes: No visual changes. ENT: No sore throat. Cardiovascular: Denies chest pain. Respiratory: Positive for shortness of breath. Gastrointestinal: No abdominal pain.  No nausea, no vomiting.  No diarrhea.  No constipation. Genitourinary: Negative for dysuria. Musculoskeletal: Negative for back pain. Skin: Negative for rash. Neurological: Negative for headaches, focal weakness or numbness.   ____________________________________________   PHYSICAL EXAM:  VITAL SIGNS: ED Triage Vitals  Enc Vitals Group     BP 06/11/20 2133 (!) 172/103     Pulse Rate 06/11/20 2133 (!) 115     Resp 06/11/20 2133 (!) 24     Temp 06/11/20 2133 98.3 F (36.8 C)     Temp Source 06/11/20 2133 Oral     SpO2 06/11/20 2132 95 %     Weight 06/11/20 2133 250 lb (113.4  kg)     Height 06/11/20 2133 5\' 3"  (1.6 m)     Head Circumference --      Peak Flow --      Pain Score 06/11/20 2133 0     Pain Loc --      Pain Edu? --      Excl. in [place]? --     Constitutional: Alert and oriented. Well appearing and in mild acute distress. Eyes: Conjunctivae are normal. PERRL. EOMI. Head: Atraumatic. Nose: No congestion/rhinnorhea. Mouth/Throat: Mucous membranes are moist.   Neck: No stridor.   Cardiovascular: Tachycardic rate, regular rhythm. Grossly normal heart sounds.  Good peripheral  circulation. Respiratory: Increased respiratory effort.  No retractions. Lungs CTAB. Gastrointestinal: Soft and nontender. No distention. No abdominal bruits. No CVA tenderness. Musculoskeletal: No lower extremity tenderness nor edema.  No joint effusions. Neurologic:  Normal speech and language. No gross focal neurologic deficits are appreciated. No gait instability. Skin:  Skin is warm, dry and intact. No rash noted. Psychiatric: Mood and affect are normal. Speech and behavior are normal.  ____________________________________________   LABS (all labs ordered are listed, but only abnormal results are displayed)  Labs Reviewed  CBC WITH DIFFERENTIAL/PLATELET - Abnormal; Notable for the following components:      Result Value   Abs Immature Granulocytes 0.09 (*)    All other components within normal limits  COMPREHENSIVE METABOLIC PANEL - Abnormal; Notable for the following components:   Glucose, Bld 130 (*)    BUN 27 (*)    Calcium 8.8 (*)    All other components within normal limits  RESP PANEL BY RT-PCR (FLU A&B, COVID) ARPGX2  BRAIN NATRIURETIC PEPTIDE  TROPONIN I (HIGH SENSITIVITY)  TROPONIN I (HIGH SENSITIVITY)   ____________________________________________  EKG  ED ECG REPORT I, Calista Crain J, the attending physician, personally viewed and interpreted this ECG.   Date: 06/11/2020  EKG Time: 2141  Rate: 114  Rhythm: sinus tachycardia  Axis: Normal  Intervals:none  ST&T Change: Nonspecific  ____________________________________________  RADIOLOGY I, Takia Runyon J, personally viewed and evaluated these images (plain radiographs) as part of my medical decision making, as well as reviewing the written report by the radiologist.  ED MD interpretation: Left lung base atelectasis versus scar; CTA demonstrates no PE  Official radiology report(s): DG Chest 2 View  Result Date: 06/11/2020 CLINICAL DATA:  Shortness of breath EXAM: CHEST - 2 VIEW COMPARISON:  04/04/2018  FINDINGS: Lingular/left base atelectasis or scarring. Right lung clear. Heart is normal size. No acute bony abnormality or effusions. IMPRESSION: Left base atelectasis or scarring. Electronically Signed   By: Rolm Baptise M.D.   On: 06/11/2020 22:22   CT Angio Chest PE W/Cm &/Or Wo Cm  Result Date: 06/12/2020 CLINICAL DATA:  Shortness of breath for 6 weeks, PE suspected, EXAM: CT ANGIOGRAPHY CHEST WITH CONTRAST TECHNIQUE: Multidetector CT imaging of the chest was performed using the standard protocol during bolus administration of intravenous contrast. Multiplanar CT image reconstructions and MIPs were obtained to evaluate the vascular anatomy. CONTRAST:  172mL OMNIPAQUE IOHEXOL 350 MG/ML SOLN COMPARISON:  Radiograph 04/04/2018, 06/11/2020 FINDINGS: Cardiovascular: While there is satisfactory opacification of the pulmonary arteries, examination is significantly limited by extensive respiratory motion artifact. No large central filling defects are identified. Some hypoattenuation in the segmental branches of the right lower lobe are favored to be motion artifact related particularly given the negative D-dimer. Central pulmonary arteries are normal caliber. Normal heart size. No pericardial effusion. Coronary artery calcifications are seen. Few calcifications on  the aortic leaflets. The aortic root is suboptimally assessed given cardiac pulsation artifact. Atherosclerotic plaque within the normal caliber aorta. No acute luminal abnormality of the imaged aorta. No periaortic stranding or hemorrhage. Normal 3 vessel branching of the aortic arch. Proximal great vessels are unremarkable. Mediastinum/Nodes: No mediastinal fluid or gas. Normal thyroid gland and thoracic inlet. No acute abnormality of the trachea or esophagus. No worrisome mediastinal, hilar or axillary adenopathy. Lungs/Pleura: Postsurgical changes are seen in the right lung base compatible with history of partial right lower lobectomy. Some diffuse  mild dependent atelectatic changes, possibly accentuated by imaging during exhalation for the angiographic technique. Some additional bandlike areas of opacity in the lung bases may reflect further volume loss or scarring. No consolidation, features of edema, pneumothorax, or effusion. No suspicious pulmonary nodules or masses. Upper Abdomen: Prior cholecystectomy. No acute abnormalities present in the visualized portions of the upper abdomen. Musculoskeletal: Exaggerated thoracic kyphosis with multilevel discogenic and facet degenerative changes. Some mild remote anterior wedging of the T6, T7 vertebrae is grossly unchanged from comparison radiography. Associated chest wall deformity with exaggerated AP diameter. No acute or conspicuous osseous lesions. No worrisome chest wall masses or lesions. Review of the MIP images confirms the above findings. IMPRESSION: 1. While there is satisfactory opacification of the pulmonary arteries, examination is significantly limited by extensive respiratory motion artifact. No large central filling defects are identified. Some hypoattenuation about the segmental branches of the right lower lobe are favored to be motion artifact related particularly given the negative D-dimer. 2. Basilar and dependent atelectatic changes. No other acute intrathoracic process. 3. Postsurgical changes in the right lung base compatible with history of partial right lower lobectomy. 4. Mild remote anterior wedging of the T6, T7 vertebrae is favored to be chronic/remote. Associated chest wall deformity with increased AP diameter of the chest. 5. Aortic Atherosclerosis (ICD10-I70.0). Electronically Signed   By: Lovena Le M.D.   On: 06/12/2020 03:40    ____________________________________________   PROCEDURES  Procedure(s) performed (including Critical Care):  .1-3 Lead EKG Interpretation Performed by: Paulette Blanch, MD Authorized by: Paulette Blanch, MD     Interpretation: abnormal     ECG  rate:  103   ECG rate assessment: tachycardic     Rhythm: sinus tachycardia     Ectopy: none     Conduction: normal   Comments:     Patient placed on cardiac monitor to evaluate for arrhythmias    CRITICAL CARE Performed by: Paulette Blanch   Total critical care time: 45 minutes  Critical care time was exclusive of separately billable procedures and treating other patients.  Critical care was necessary to treat or prevent imminent or life-threatening deterioration.  Critical care was time spent personally by me on the following activities: development of treatment plan with patient and/or surrogate as well as nursing, discussions with consultants, evaluation of patient's response to treatment, examination of patient, obtaining history from patient or surrogate, ordering and performing treatments and interventions, ordering and review of laboratory studies, ordering and review of radiographic studies, pulse oximetry and re-evaluation of patient's condition.  ____________________________________________   INITIAL IMPRESSION / ASSESSMENT AND PLAN / ED COURSE  As part of my medical decision making, I reviewed the following data within the [place] History obtained from family, Nursing notes reviewed and incorporated, Labs reviewed, EKG interpreted, Old chart reviewed, Radiograph reviewed and Notes from prior ED visits     65 year old female with a history of lung cancer and  asthma presenting with a 6-week history of shortness of breath. Differential includes, but is not limited to, viral syndrome, bronchitis including COPD exacerbation, pneumonia, reactive airway disease including asthma, CHF including exacerbation with or without pulmonary/interstitial edema, pneumothorax, ACS, thoracic trauma, and pulmonary embolism.  Chest x-ray unremarkable.  Will obtain lab work, COVID swab.  Ambulation trial. CTA chest to evaluate for PE.   Clinical Course as of 06/12/20 0524  Sun  Jun 12, 2020  4008 Delay secondary to lab.  Updated patient and spouse on all test results.  Patient dropped to 88% room air with increased tachypnea and tachycardia on her ambulation trial.  She is currently on 2 L nasal cannula oxygen with saturations 94%.  Currently tachypneic.  Will order another DuoNeb.  Will discuss with hospital services for admission. [JS]    Clinical Course User Index [JS] Paulette Blanch, MD     ____________________________________________   FINAL CLINICAL IMPRESSION(S) / ED DIAGNOSES  Final diagnoses:  Shortness of breath  Moderate persistent asthma with exacerbation  Hypoxia     ED Discharge Orders    None      *Please note:  Lindsay Wiggins was evaluated in Emergency Department on 06/12/2020 for the symptoms described in the history of present illness. She was evaluated in the context of the global COVID-19 pandemic, which necessitated consideration that the patient might be at risk for infection with the SARS-CoV-2 virus that causes COVID-19. Institutional protocols and algorithms that pertain to the evaluation of patients at risk for COVID-19 are in a state of rapid change based on information released by regulatory bodies including the CDC and federal and state organizations. These policies and algorithms were followed during the patient's care in the ED.  Some ED evaluations and interventions may be delayed as a result of limited staffing during and the pandemic.*   Note:  This document was prepared using Dragon voice recognition software and may include unintentional dictation errors.   Paulette Blanch, MD 06/12/20 [phone]

## 2020-06-12 ENCOUNTER — Emergency Department: Payer: Medicare PPO

## 2020-06-12 DIAGNOSIS — J45901 Unspecified asthma with (acute) exacerbation: Secondary | ICD-10-CM | POA: Diagnosis present

## 2020-06-12 DIAGNOSIS — R030 Elevated blood-pressure reading, without diagnosis of hypertension: Secondary | ICD-10-CM

## 2020-06-12 DIAGNOSIS — J4541 Moderate persistent asthma with (acute) exacerbation: Secondary | ICD-10-CM | POA: Diagnosis not present

## 2020-06-12 LAB — RESPIRATORY PANEL BY PCR

## 2020-06-12 LAB — COMPREHENSIVE METABOLIC PANEL
ALT: 26 U/L (ref 0–44)
AST: 30 U/L (ref 15–41)
Albumin: 3.8 g/dL (ref 3.5–5.0)
Alkaline Phosphatase: 87 U/L (ref 38–126)
Anion gap: 11 (ref 5–15)
BUN: 27 mg/dL — ABNORMAL HIGH (ref 8–23)
CO2: 25 mmol/L (ref 22–32)
Calcium: 8.8 mg/dL — ABNORMAL LOW (ref 8.9–10.3)
Chloride: 103 mmol/L (ref 98–111)
Creatinine, Ser: 0.99 mg/dL (ref 0.44–1.00)
GFR, Estimated: >60 mL/min (ref >60)
Glucose, Bld: 130 mg/dL — ABNORMAL HIGH (ref 70–99)
Potassium: 4.2 mmol/L (ref 3.5–5.1)
Sodium: 139 mmol/L (ref 135–145)
Total Bilirubin: 0.6 mg/dL (ref 0.3–1.2)
Total Protein: 7 g/dL (ref 6.5–8.1)

## 2020-06-12 LAB — HIV ANTIBODY (ROUTINE TESTING W REFLEX): HIV Screen 4th Generation wRfx: NONREACTIVE

## 2020-06-12 LAB — CBC WITH DIFFERENTIAL/PLATELET
Abs Immature Granulocytes: 0.09 10*3/uL — ABNORMAL HIGH (ref 0.00–0.07)
Basophils Absolute: 0.1 10*3/uL (ref 0.0–0.1)
Basophils Relative: 1 %
Eosinophils Absolute: 0.2 10*3/uL (ref 0.0–0.5)
Eosinophils Relative: 2 %
HCT: 45.5 % (ref 36.0–46.0)
Hemoglobin: 14 g/dL (ref 12.0–15.0)
Immature Granulocytes: 1 %
Lymphocytes Relative: 22 %
Lymphs Abs: 2 10*3/uL (ref 0.7–4.0)
MCH: 28.5 pg (ref 26.0–34.0)
MCHC: 30.8 g/dL (ref 30.0–36.0)
MCV: 92.7 fL (ref 80.0–100.0)
Monocytes Absolute: 1 10*3/uL (ref 0.1–1.0)
Monocytes Relative: 11 %
Neutro Abs: 5.8 10*3/uL (ref 1.7–7.7)
Neutrophils Relative %: 63 %
Platelets: 227 10*3/uL (ref 150–400)
RBC: 4.91 MIL/uL (ref 3.87–5.11)
RDW: 13.4 % (ref 11.5–15.5)
WBC: 9.2 10*3/uL (ref 4.0–10.5)
nRBC: 0 % (ref 0.0–0.2)

## 2020-06-12 LAB — TROPONIN I (HIGH SENSITIVITY)
Troponin I (High Sensitivity): 8 ng/L (ref <18)
Troponin I (High Sensitivity): 9 ng/L (ref <18)

## 2020-06-12 LAB — MRSA PCR SCREENING: MRSA by PCR: NEGATIVE

## 2020-06-12 LAB — RESP PANEL BY RT-PCR (FLU A&B, COVID) ARPGX2
Influenza A by PCR: NEGATIVE
Influenza B by PCR: NEGATIVE
SARS Coronavirus 2 by RT PCR: NEGATIVE

## 2020-06-12 LAB — BRAIN NATRIURETIC PEPTIDE: B Natriuretic Peptide: 13.6 pg/mL (ref 0.0–100.0)

## 2020-06-12 LAB — PROCALCITONIN: Procalcitonin: <0.1 ng/mL

## 2020-06-12 MED ORDER — PANTOPRAZOLE SODIUM 40 MG PO TBEC
40.0000 mg | DELAYED_RELEASE_TABLET | Freq: Every day | ORAL | Status: DC
  Administered 2020-06-12 – 2020-06-13 (×2): 40 mg via ORAL
  Filled 2020-06-12 (×2): qty 1

## 2020-06-12 MED ORDER — SODIUM CHLORIDE 0.9% FLUSH
3.0000 mL | Freq: Two times a day (BID) | INTRAVENOUS | Status: DC
  Administered 2020-06-12 – 2020-06-13 (×3): 3 mL via INTRAVENOUS

## 2020-06-12 MED ORDER — LEVALBUTEROL HCL 1.25 MG/0.5ML IN NEBU
1.2500 mg | INHALATION_SOLUTION | Freq: Four times a day (QID) | RESPIRATORY_TRACT | Status: DC | PRN
  Administered 2020-06-13: 1.25 mg via RESPIRATORY_TRACT
  Filled 2020-06-12: qty 0.5

## 2020-06-12 MED ORDER — ENOXAPARIN SODIUM 40 MG/0.4ML ~~LOC~~ SOLN
40.0000 mg | SUBCUTANEOUS | Status: DC
  Administered 2020-06-12 – 2020-06-13 (×2): 40 mg via SUBCUTANEOUS
  Filled 2020-06-12 (×2): qty 0.4

## 2020-06-12 MED ORDER — IOHEXOL 350 MG/ML SOLN
100.0000 mL | Freq: Once | INTRAVENOUS | Status: AC | PRN
  Administered 2020-06-12: 100 mL via INTRAVENOUS

## 2020-06-12 MED ORDER — CYCLOBENZAPRINE HCL 10 MG PO TABS
10.0000 mg | ORAL_TABLET | Freq: Every evening | ORAL | Status: DC | PRN
  Administered 2020-06-12 (×2): 10 mg via ORAL
  Filled 2020-06-12 (×2): qty 1

## 2020-06-12 MED ORDER — SODIUM CHLORIDE 0.9% FLUSH: 3.0000 mL | INTRAVENOUS | Status: DC | PRN

## 2020-06-12 MED ORDER — SODIUM CHLORIDE 0.9 % IV SOLN: 250.0000 mL | INTRAVENOUS | Status: DC | PRN

## 2020-06-12 MED ORDER — MAGNESIUM OXIDE 400 MG PO TABS
800.0000 mg | ORAL_TABLET | Freq: Every day | ORAL | Status: DC | PRN
  Filled 2020-06-12: qty 2

## 2020-06-12 MED ORDER — METHYLPREDNISOLONE SODIUM SUCC 40 MG IJ SOLR
40.0000 mg | Freq: Two times a day (BID) | INTRAMUSCULAR | Status: AC
  Administered 2020-06-12 (×2): 40 mg via INTRAVENOUS
  Filled 2020-06-12 (×2): qty 1

## 2020-06-12 MED ORDER — MONTELUKAST SODIUM 10 MG PO TABS
10.0000 mg | ORAL_TABLET | Freq: Every day | ORAL | Status: DC
  Administered 2020-06-12: 10 mg via ORAL
  Filled 2020-06-12 (×2): qty 1

## 2020-06-12 MED ORDER — ALUM & MAG HYDROXIDE-SIMETH 200-200-20 MG/5ML PO SUSP: 15.0000 mL | Freq: Four times a day (QID) | ORAL | Status: DC | PRN

## 2020-06-12 MED ORDER — PREDNISONE 20 MG PO TABS
40.0000 mg | ORAL_TABLET | Freq: Every day | ORAL | Status: DC
  Administered 2020-06-13: 40 mg via ORAL
  Filled 2020-06-12: qty 2

## 2020-06-12 MED ORDER — IPRATROPIUM-ALBUTEROL 0.5-2.5 (3) MG/3ML IN SOLN
3.0000 mL | Freq: Once | RESPIRATORY_TRACT | Status: AC
  Administered 2020-06-12: 3 mL via RESPIRATORY_TRACT
  Filled 2020-06-12: qty 3

## 2020-06-12 MED ORDER — MOMETASONE FURO-FORMOTEROL FUM 200-5 MCG/ACT IN AERO
2.0000 | INHALATION_SPRAY | Freq: Two times a day (BID) | RESPIRATORY_TRACT | Status: DC
  Administered 2020-06-12 – 2020-06-13 (×3): 2 via RESPIRATORY_TRACT
  Filled 2020-06-12: qty 8.8

## 2020-06-12 NOTE — H&P (Addendum)
History and Physical    [name] ESP:[id number] DOB: [birth year] DOA: 06/11/2020  PCP: Dereck Ligas, DO   Lindsay Wiggins coming from: Home  I have personally briefly reviewed Lindsay Wiggins's old medical records in [place]  Chief Complaint: Shortness of breath  HPI: Lindsay Wiggins is a 65 y.o. female with medical history significant for non-small cell lung cancer status post resection, morbid obesity history of asthma who presents to the emergency room for evaluation of shortness of breath which she has had for about 6 weeks. Lindsay Wiggins has received 2 courses of steroid taper over the last 6 weeks with transient improvement in her symptoms and then recurrence.  Last taper was about 3 days prior to this admission. She has had multiple COVID tests as well as PCR testing for RSV which came back negative. Shortness of breath is mostly with exertion and associated with chest tightness and occasional dry cough.  She has mild lower extremity swelling and is unable to lay flat in bed. She denies having any fever, no chills, no abdominal pain, no nausea, no vomiting, no dizziness, no lightheadedness, no diaphoresis, no palpitations, no headache, no blurred vision, no mental status changes, no urinary symptoms or any changes in her bowel habits. Labs show sodium 139, potassium 4.2, chloride 103, bicarb 25, glucose 130, BUN 27, creatinine 0.9, calcium 8.8, alkaline phosphatase 87, albumin 3.8, AST 30, ALT 26, total protein 7.0, BNP 13.6, troponin 8, white count 9.2, hemoglobin 14.0, hematocrit 45, MCV 92.7, RDW 13.4, platelet count 227 Respiratory viral panel is negative Chest x-ray reviewed by me shows left base atelectasis or scarring. CT angiogram of the chest shows  satisfactory opacification of the pulmonary arteries, examination is significantly limited by extensive respiratory motion artifact. No large central filling defects are identified. Some hypoattenuation about the segmental branches of  the right lower lobe are favored to be motion artifact related particularly given the negative D-dimer. Basilar and dependent atelectatic changes. No other acute intrathoracic process. Postsurgical changes in the right lung base compatible with history of partial right lower lobectomy. Mild remote anterior wedging of the T6, T7 vertebrae is favored to be chronic/remote. Associated chest wall deformity with increased basilar AP diameter of the chest. Aortic Atherosclerosis. Twelve-lead EKG reviewed by me shows sinus tachycardia with nonspecific ST abnormality.   ED Course: Lindsay Wiggins is a 65 year old Caucasian female who presents to the emergency room for evaluation of worsening shortness of breath from her baseline mostly with exertion associated with an intermittent dry cough.  Her symptoms have been ongoing for about 6 weeks and she has received 2 courses of steroid taper with transient improvement in her symptoms. Lindsay Wiggins was seen in the ER and treated with bronchodilators and systemic steroids.  She was ambulated prior to discharge and her  post ambulatory pulse ox was 88% on room air with associated tachypnea.  Lindsay Wiggins is currently on 2 L of oxygen via nasal cannula. She will be admitted to the hospital for further evaluation.   Review of Systems: As per HPI otherwise all other systems reviewed and negative.    Past Medical History:  Diagnosis Date  . Asthma   . Cancer ([place]) 2010   lung ca- most of rt lung resected    Past Surgical History:  Procedure Laterality Date  . ABDOMINAL HYSTERECTOMY    . LUNG LOBECTOMY Right      reports that she has never smoked. She has never used smokeless tobacco. She reports current alcohol use. No history on file  for drug use.  Allergies  Allergen Reactions  . Penicillins Shortness Of Breath    Family History  Problem Relation Age of Onset  . Breast cancer Neg Hx       Prior to Admission medications   Medication Sig Start Date End Date  Taking? Authorizing Provider  albuterol (VENTOLIN HFA) 108 (90 Base) MCG/ACT inhaler Inhale 2 puffs into the lungs every 4 (four) hours as needed for wheezing or shortness of breath.   Yes [provider]  cyclobenzaprine (FLEXERIL) 10 MG tablet Take 10 mg by mouth at bedtime as needed for muscle spasms.   Yes [provider]  Fluticasone-Salmeterol (ADVAIR) 500-50 MCG/DOSE AEPB Inhale 1 puff into the lungs 2 (two) times daily.   Yes [provider]  levalbuterol (XOPENEX) 1.25 MG/3ML nebulizer solution Take 1.25 mg by nebulization every 6 (six) hours as needed for shortness of breath or wheezing. 06/02/20  Yes [provider]  magnesium oxide (MAG-OX) 400 MG tablet Take 800 mg by mouth daily as needed (leg cramps).   Yes [provider]  montelukast (SINGULAIR) 10 MG tablet Take 10 mg by mouth at bedtime.   Yes [provider]  Spacer/Aero-Holding Chambers (AEROCHAMBER MV) inhaler Use as instructed 05/02/20  Yes Margarette Canada, NP    Physical Exam: Vitals:   06/12/20 0100 06/12/20 0130 06/12/20 0200 06/12/20 0615  BP: (!) 155/103 (!) 147/93 (!) 157/100 (!) 164/98  Pulse: (!) 103 (!) 101 100 (!) 103  Resp: (!) 24 18 (!) 23 (!) 24  Temp:      TempSrc:      SpO2: 95% 93% 94% 98%  Weight:      Height:         Vitals:   06/12/20 0100 06/12/20 0130 06/12/20 0200 06/12/20 0615  BP: (!) 155/103 (!) 147/93 (!) 157/100 (!) 164/98  Pulse: (!) 103 (!) 101 100 (!) 103  Resp: (!) 24 18 (!) 23 (!) 24  Temp:      TempSrc:      SpO2: 95% 93% 94% 98%  Weight:      Height:          Constitutional: Alert and oriented x 3. Not in any apparent distress.  Morbidly obese HEENT:      Head: Normocephalic and atraumatic.         Eyes: PERLA, EOMI, Conjunctivae are normal. Sclera is non-icteric.       Mouth/Throat: Mucous membranes are moist.       Neck: Supple with no signs of meningismus. Cardiovascular:  Tachycardic. No murmurs, gallops, or rubs. 2+  symmetrical distal pulses are present . No JVD.  Trace LE edema Respiratory: Respiratory effort normal. Bilateral air entry in both lung fields, scattered wheezes, crackles, or rhonchi.  Gastrointestinal: Soft, non tender, and non distended with positive bowel sounds.  Genitourinary: No CVA tenderness. Musculoskeletal: Nontender with normal range of motion in all extremities. No cyanosis, or erythema of extremities. Neurologic:  Face is symmetric. Moving all extremities. No gross focal neurologic deficits  Skin: Skin is warm, dry.  No rash or ulcers Psychiatric: Mood and affect are normal   Labs on Admission: I have personally reviewed following labs and imaging studies  CBC: Recent Labs  Lab 06/11/20 2338  WBC 9.2  NEUTROABS 5.8  HGB 14.0  HCT 45.5  MCV 92.7  PLT 073   Basic Metabolic Panel: Recent Labs  Lab 06/12/20 0204  NA 139  K 4.2  CL 103  CO2 25  GLUCOSE 130*  BUN 27*  CREATININE 0.99  CALCIUM 8.8*   GFR: Estimated Creatinine Clearance: 68.7 mL/min (by C-G formula based on SCr of 0.99 mg/dL). Liver Function Tests: Recent Labs  Lab 06/12/20 0204  AST 30  ALT 26  ALKPHOS 87  BILITOT 0.6  PROT 7.0  ALBUMIN 3.8   No results for input(s): LIPASE, AMYLASE in the last 168 hours. No results for input(s): AMMONIA in the last 168 hours. Coagulation Profile: No results for input(s): INR, PROTIME in the last 168 hours. Cardiac Enzymes: No results for input(s): CKTOTAL, CKMB, CKMBINDEX, TROPONINI in the last 168 hours. BNP (last 3 results) No results for input(s): PROBNP in the last 8760 hours. HbA1C: No results for input(s): HGBA1C in the last 72 hours. CBG: No results for input(s): GLUCAP in the last 168 hours. Lipid Profile: No results for input(s): CHOL, HDL, LDLCALC, TRIG, CHOLHDL, LDLDIRECT in the last 72 hours. Thyroid Function Tests: No results for input(s): TSH, T4TOTAL, FREET4, T3FREE, THYROIDAB in the last 72 hours. Anemia Panel: No results for  input(s): VITAMINB12, FOLATE, FERRITIN, TIBC, IRON, RETICCTPCT in the last 72 hours. Urine analysis:    Component Value Date/Time   COLORURINE YELLOW 05/09/2019 1326   APPEARANCEUR CLEAR 05/09/2019 1326   LABSPEC 1.025 05/09/2019 1326   PHURINE 6.0 05/09/2019 1326   GLUCOSEU NEGATIVE 05/09/2019 1326   HGBUR NEGATIVE 05/09/2019 1326   BILIRUBINUR NEGATIVE 05/09/2019 1326   KETONESUR TRACE (A) 05/09/2019 1326   PROTEINUR NEGATIVE 05/09/2019 1326   NITRITE POSITIVE (A) 05/09/2019 1326   LEUKOCYTESUR NEGATIVE 05/09/2019 1326    Radiological Exams on Admission: DG Chest 2 View  Result Date: 06/11/2020 CLINICAL DATA:  Shortness of breath EXAM: CHEST - 2 VIEW COMPARISON:  04/04/2018 FINDINGS: Lingular/left base atelectasis or scarring. Right lung clear. Heart is normal size. No acute bony abnormality or effusions. IMPRESSION: Left base atelectasis or scarring. Electronically Signed   By: Rolm Baptise M.D.   On: 06/11/2020 22:22   CT Angio Chest PE W/Cm &/Or Wo Cm  Result Date: 06/12/2020 CLINICAL DATA:  Shortness of breath for 6 weeks, PE suspected, EXAM: CT ANGIOGRAPHY CHEST WITH CONTRAST TECHNIQUE: Multidetector CT imaging of the chest was performed using the standard protocol during bolus administration of intravenous contrast. Multiplanar CT image reconstructions and MIPs were obtained to evaluate the vascular anatomy. CONTRAST:  153mL OMNIPAQUE IOHEXOL 350 MG/ML SOLN COMPARISON:  Radiograph 04/04/2018, 06/11/2020 FINDINGS: Cardiovascular: While there is satisfactory opacification of the pulmonary arteries, examination is significantly limited by extensive respiratory motion artifact. No large central filling defects are identified. Some hypoattenuation in the segmental branches of the right lower lobe are favored to be motion artifact related particularly given the negative D-dimer. Central pulmonary arteries are normal caliber. Normal heart size. No pericardial effusion. Coronary artery  calcifications are seen. Few calcifications on the aortic leaflets. The aortic root is suboptimally assessed given cardiac pulsation artifact. Atherosclerotic plaque within the normal caliber aorta. No acute luminal abnormality of the imaged aorta. No periaortic stranding or hemorrhage. Normal 3 vessel branching of the aortic arch. Proximal great vessels are unremarkable. Mediastinum/Nodes: No mediastinal fluid or gas. Normal thyroid gland and thoracic inlet. No acute abnormality of the trachea or esophagus. No worrisome mediastinal, hilar or axillary adenopathy. Lungs/Pleura: Postsurgical changes are seen in the right lung base compatible with history of partial right lower lobectomy. Some diffuse mild dependent atelectatic changes, possibly accentuated by imaging during exhalation for the angiographic technique. Some additional bandlike areas of opacity in the  lung bases may reflect further volume loss or scarring. No consolidation, features of edema, pneumothorax, or effusion. No suspicious pulmonary nodules or masses. Upper Abdomen: Prior cholecystectomy. No acute abnormalities present in the visualized portions of the upper abdomen. Musculoskeletal: Exaggerated thoracic kyphosis with multilevel discogenic and facet degenerative changes. Some mild remote anterior wedging of the T6, T7 vertebrae is grossly unchanged from comparison radiography. Associated chest wall deformity with exaggerated AP diameter. No acute or conspicuous osseous lesions. No worrisome chest wall masses or lesions. Review of the MIP images confirms the above findings. IMPRESSION: 1. While there is satisfactory opacification of the pulmonary arteries, examination is significantly limited by extensive respiratory motion artifact. No large central filling defects are identified. Some hypoattenuation about the segmental branches of the right lower lobe are favored to be motion artifact related particularly given the negative D-dimer. 2. Basilar  and dependent atelectatic changes. No other acute intrathoracic process. 3. Postsurgical changes in the right lung base compatible with history of partial right lower lobectomy. 4. Mild remote anterior wedging of the T6, T7 vertebrae is favored to be chronic/remote. Associated chest wall deformity with increased AP diameter of the chest. 5. Aortic Atherosclerosis (ICD10-I70.0). Electronically Signed   By: Lovena Le M.D.   On: 06/12/2020 03:40     Assessment/Plan Principal Problem:   Asthma exacerbation Active Problems:   Obesity, Class III, BMI 40-49.9 (morbid obesity) (HCC)   Elevated blood pressure reading without diagnosis of hypertension     Acute asthma exacerbation ??  Moderate persistent asthma Lindsay Wiggins presents with a 6-week history of exertional shortness of breath worse when compared to her baseline associated with a nonproductive cough. She has received 2 courses of steroid taper over the last 6 weeks with transient improvement in her symptoms. Lindsay Wiggins received with diabetes in the ER as well as systemic steroids and prior to discharge was ambulated in the emergency room with post ambulatory pulse ox of 88% She is currently on 2 L of oxygen with pulse oximetry greater than 90 We will place Lindsay Wiggins on Solu-Medrol 40 mg IV every 12 Continue as needed bronchodilator therapy as well as inhaled steroids Continue Singulair We will request pulmonary consult    Morbid obesity Complicates overall prognosis and care    Elevated blood pressure without diagnosis of hypertension Lindsay Wiggins's blood pressure has been elevated since arrival to the ER We will start Lindsay Wiggins on a low-dose of amlodipine 5 minutes daily  DVT prophylaxis: Lovenox Code Status: full code  Family Communication: Greater than 50% of time was spent discussing Lindsay Wiggins's condition and plan of care with her at the bedside.  All questions and concerns have been addressed.  She verbalizes understanding and agrees with  the plan. Disposition Plan: Back to previous home environment Consults called: Pulmonary Status: At the time of admission, it appears that the appropriate admission status for this Lindsay Wiggins is inpatient. This is judged to be reasonable and necessary in order to provide the required intensity of service to ensure the Lindsay Wiggins's safety given the presenting symptoms, physical exam findings, and initial radiographic and laboratory data in the context of their comorbid conditions. Lindsay Wiggins requires inpatient status due to high intensity of service, high risk for further deterioration and high frequency of surveillance required.    Collier Bullock MD [facility]     06/12/2020, 8:41 AM

## 2020-06-12 NOTE — Consult Note (Signed)
Pulmonary Medicine          Date: 06/12/2020,   MRN# [id number] Lindsay Wiggins January 26, 1956     AdmissionWeight: 113.4 kg                 CurrentWeight: 113.4 kg   Referring physician: Dr Lindsay Wiggins   CHIEF COMPLAINT:   Recurrent respiratory distress in context of Asthma with hx of lung cancer.   HISTORY OF PRESENT ILLNESS   65 yo F hx of NSCLC post resection, obesity, hx of chronic asthma.  She has had chronic cough and required multiple rounds of systemic steroids in past few months. She had finished most recent taper few days prior to admission. She is negative for COVID and RSV. She has chronic mild LE edema. She had blood work done with essentially normal BMP and CBC. She had BNP done which was normal.  She had CXR and CTPE done while in ER. I reviewed imaging myself.  She is s/p resection partial right lower lobectomy. She has mild bibasilar atelectasis and mild bronchitic changes diffusely. There was no PE found on this study but motion artifact hindered evaluation of smaller vessels. Imporantly there is absence of infiltrate/consolidation, mass, edema, effusion, pneumothorax, pmeumomediastinum, possible fibrotic changes noted worse at bases posteriorly which may be due to atelectatic changes/obese body habitus. She was hypertensive and tachycardiac throughout admission however normoxic on room air. She sees [facility] for chronic asthma with cough variant, was noted to have elvation of ddimer recently was on tapering burst of prednisone.    PAST MEDICAL HISTORY   Past Medical History:  Diagnosis Date  . Asthma   . Cancer ([place]) 2010   lung ca- most of rt lung resected     SURGICAL HISTORY   Past Surgical History:  Procedure Laterality Date  . ABDOMINAL HYSTERECTOMY    . LUNG LOBECTOMY Right      FAMILY HISTORY   Family History  Problem Relation Age of Onset  . Breast cancer Neg Hx      SOCIAL HISTORY   Social History   Tobacco Use  . Smoking status: Never  Smoker  . Smokeless tobacco: Never Used  Vaping Use  . Vaping Use: Never used  Substance Use Topics  . Alcohol use: Yes     MEDICATIONS    Home Medication:  Current Outpatient Rx  . Order #: [id number] Class: Historical Med  . Order #: [id number] Class: Historical Med  . Order #: [id number] Class: Historical Med  . Order #: [id number] Class: Historical Med  . Order #: [id number] Class: Historical Med  . Order #: [id number] Class: Historical Med  . Order #: [id number] Class: Normal    Current Medication:  Current Facility-Administered Medications:  .  0.9 %  sodium chloride infusion, 250 mL, Intravenous, PRN, Agbata, Tochukwu, MD .  cyclobenzaprine (FLEXERIL) tablet 10 mg, 10 mg, Oral, QHS PRN, Agbata, Tochukwu, MD .  enoxaparin (LOVENOX) injection 40 mg, 40 mg, Subcutaneous, Q24H, Agbata, Tochukwu, MD .  levalbuterol (XOPENEX) nebulizer solution 1.25 mg, 1.25 mg, Nebulization, Q6H PRN, Agbata, Tochukwu, MD .  magnesium oxide (MAG-OX) tablet 800 mg, 800 mg, Oral, Daily PRN, Agbata, Tochukwu, MD .  methylPREDNISolone sodium succinate (SOLU-MEDROL) 40 mg/mL injection 40 mg, 40 mg, Intravenous, Q12H **FOLLOWED BY** [START ON 06/13/2020] predniSONE (DELTASONE) tablet 40 mg, 40 mg, Oral, Q breakfast, Agbata, Tochukwu, MD .  mometasone-formoterol (DULERA) 200-5 MCG/ACT inhaler 2 puff, 2 puff, Inhalation, BID, Agbata, Tochukwu, MD .  montelukast (SINGULAIR) tablet 10 mg, 10 mg, Oral, QHS,  Agbata, Tochukwu, MD .  sodium chloride flush (NS) 0.9 % injection 3 mL, 3 mL, Intravenous, Q12H, Agbata, Tochukwu, MD .  sodium chloride flush (NS) 0.9 % injection 3 mL, 3 mL, Intravenous, PRN, Agbata, Tochukwu, MD  Current Outpatient Medications:  .  albuterol (VENTOLIN HFA) 108 (90 Base) MCG/ACT inhaler, Inhale 2 puffs into the lungs every 4 (four) hours as needed for wheezing or shortness of breath., Disp: , Rfl:  .  cyclobenzaprine (FLEXERIL) 10 MG tablet, Take 10 mg by mouth at bedtime as needed for muscle  spasms., Disp: , Rfl:  .  Fluticasone-Salmeterol (ADVAIR) 500-50 MCG/DOSE AEPB, Inhale 1 puff into the lungs 2 (two) times daily., Disp: , Rfl:  .  levalbuterol (XOPENEX) 1.25 MG/3ML nebulizer solution, Take 1.25 mg by nebulization every 6 (six) hours as needed for shortness of breath or wheezing., Disp: , Rfl:  .  magnesium oxide (MAG-OX) 400 MG tablet, Take 800 mg by mouth daily as needed (leg cramps)., Disp: , Rfl:  .  montelukast (SINGULAIR) 10 MG tablet, Take 10 mg by mouth at bedtime., Disp: , Rfl:  .  Spacer/Aero-Holding Chambers (AEROCHAMBER MV) inhaler, Use as instructed, Disp: 1 each, Rfl: 2    ALLERGIES   Penicillins     REVIEW OF SYSTEMS    Review of Systems:  Gen:  Denies  fever, sweats, chills weigh loss  HEENT: Denies blurred vision, double vision, ear pain, eye pain, hearing loss, nose bleeds, sore throat Cardiac:  No dizziness, chest pain or heaviness, chest tightness,edema Resp:   Denies cough or sputum porduction, shortness of breath,wheezing, hemoptysis,  Gi: Denies swallowing difficulty, stomach pain, nausea or vomiting, diarrhea, constipation, bowel incontinence Gu:  Denies bladder incontinence, burning urine Ext:   Denies Joint pain, stiffness or swelling Skin: Denies  skin rash, easy bruising or bleeding or hives Endoc:  Denies polyuria, polydipsia , polyphagia or weight change Psych:   Denies depression, insomnia or hallucinations   Other:  All other systems negative   VS: BP (!) 164/98   Pulse (!) 103   Temp 98.3 F (36.8 C) (Oral)   Resp (!) 24   Ht 5\' 3"  (1.6 m)   Wt 113.4 kg   SpO2 98%   BMI 44.29 kg/m      PHYSICAL EXAM    GENERAL:NAD, no fevers, chills, no weakness no fatigue HEAD: Normocephalic, atraumatic.  EYES: Pupils equal, round, reactive to light. Extraocular muscles intact. No scleral icterus.  MOUTH: Moist mucosal membrane. Dentition intact. No abscess noted.  EAR, NOSE, THROAT: Clear without exudates. No external lesions.   NECK: Supple. No thyromegaly. No nodules. No JVD.  PULMONARY: mild wheezing and crackles worse at bases posteriorly CARDIOVASCULAR: S1 and S2. Regular rate and rhythm. No murmurs, rubs, or gallops. No edema. Pedal pulses 2+ bilaterally.  GASTROINTESTINAL: Soft, nontender, nondistended. No masses. Positive bowel sounds. No hepatosplenomegaly.  MUSCULOSKELETAL: No swelling, clubbing, or edema. Range of motion full in all extremities.  NEUROLOGIC: Cranial nerves II through XII are intact. No gross focal neurological deficits. Sensation intact. Reflexes intact.  SKIN: No ulceration, lesions, rashes, or cyanosis. Skin warm and dry. Turgor intact.  PSYCHIATRIC: Mood, affect within normal limits. The patient is awake, alert and oriented x 3. Insight, judgment intact.       IMAGING    DG Chest 2 View  Result Date: 06/11/2020 CLINICAL DATA:  Shortness of breath EXAM: CHEST - 2 VIEW COMPARISON:  04/04/2018 FINDINGS: Lingular/left base atelectasis or scarring. Right lung clear. Heart is  normal size. No acute bony abnormality or effusions. IMPRESSION: Left base atelectasis or scarring. Electronically Signed   By: Rolm Baptise M.D.   On: 06/11/2020 22:22   CT Angio Chest PE W/Cm &/Or Wo Cm  Result Date: 06/12/2020 CLINICAL DATA:  Shortness of breath for 6 weeks, PE suspected, EXAM: CT ANGIOGRAPHY CHEST WITH CONTRAST TECHNIQUE: Multidetector CT imaging of the chest was performed using the standard protocol during bolus administration of intravenous contrast. Multiplanar CT image reconstructions and MIPs were obtained to evaluate the vascular anatomy. CONTRAST:  173mL OMNIPAQUE IOHEXOL 350 MG/ML SOLN COMPARISON:  Radiograph 04/04/2018, 06/11/2020 FINDINGS: Cardiovascular: While there is satisfactory opacification of the pulmonary arteries, examination is significantly limited by extensive respiratory motion artifact. No large central filling defects are identified. Some hypoattenuation in the segmental  branches of the right lower lobe are favored to be motion artifact related particularly given the negative D-dimer. Central pulmonary arteries are normal caliber. Normal heart size. No pericardial effusion. Coronary artery calcifications are seen. Few calcifications on the aortic leaflets. The aortic root is suboptimally assessed given cardiac pulsation artifact. Atherosclerotic plaque within the normal caliber aorta. No acute luminal abnormality of the imaged aorta. No periaortic stranding or hemorrhage. Normal 3 vessel branching of the aortic arch. Proximal great vessels are unremarkable. Mediastinum/Nodes: No mediastinal fluid or gas. Normal thyroid gland and thoracic inlet. No acute abnormality of the trachea or esophagus. No worrisome mediastinal, hilar or axillary adenopathy. Lungs/Pleura: Postsurgical changes are seen in the right lung base compatible with history of partial right lower lobectomy. Some diffuse mild dependent atelectatic changes, possibly accentuated by imaging during exhalation for the angiographic technique. Some additional bandlike areas of opacity in the lung bases may reflect further volume loss or scarring. No consolidation, features of edema, pneumothorax, or effusion. No suspicious pulmonary nodules or masses. Upper Abdomen: Prior cholecystectomy. No acute abnormalities present in the visualized portions of the upper abdomen. Musculoskeletal: Exaggerated thoracic kyphosis with multilevel discogenic and facet degenerative changes. Some mild remote anterior wedging of the T6, T7 vertebrae is grossly unchanged from comparison radiography. Associated chest wall deformity with exaggerated AP diameter. No acute or conspicuous osseous lesions. No worrisome chest wall masses or lesions. Review of the MIP images confirms the above findings. IMPRESSION: 1. While there is satisfactory opacification of the pulmonary arteries, examination is significantly limited by extensive respiratory motion  artifact. No large central filling defects are identified. Some hypoattenuation about the segmental branches of the right lower lobe are favored to be motion artifact related particularly given the negative D-dimer. 2. Basilar and dependent atelectatic changes. No other acute intrathoracic process. 3. Postsurgical changes in the right lung base compatible with history of partial right lower lobectomy. 4. Mild remote anterior wedging of the T6, T7 vertebrae is favored to be chronic/remote. Associated chest wall deformity with increased AP diameter of the chest. 5. Aortic Atherosclerosis (ICD10-I70.0). Electronically Signed   By: Lovena Le M.D.   On: 06/12/2020 03:40      ASSESSMENT/PLAN   Recurrent respiratory distress Likely due to acute asthma exacerbation -There is absence of fever or leukocytosis to suggest acute infection, and CT chest is not concerning regarding PE or acute Pneumonia.   -She seems improved slightly arleady post IV solumedrol dose  -She is speaking infull sentences during interview in no distress post steroids and nebulizer therapy -There are mild ground glass opacities which may be due to expiratory images vs restriction from obese body habitus, would perform workup for allergic bronchopulmonary aspergillosis  and allergic bronchopulmonary mycosis as well as Churg Strauss/ EGPA and non COVID viral panel -RVP -workup for atypical pneumonia - mycoplasma, legionella etc -hypersensitivity pneumonitis panel -ANCA panel -ANA comprehensive -Procalcitonin and MRSA PCR for antimicrobial guidance -Fungitell serum  -Aspergillus ab -Histoplasma urine ag - patient noted to have new small pulm nodule on previous imaging -she notes new onset orthopnea which may indicate onset of cardiac valvular disease and will need further evaluation via full 2D echo -She denies having uncontrolled GERD, takes PRN H2 blockers     Bibasilar atelectasis  - likely due to deconditioning, asthma ,  and morbid obesity  - will need to perform recruitment maneuvers and chest physiotherapy - initiate BID metaNEB therapy with albuterol  - PT/OT on outpatient     Thank you for allowing me to participate in the care of this patient.   This document was prepared using Dragon voice recognition software and may include unintentional dictation errors.     Ottie Glazier, M.D.  Division of [facility]

## 2020-06-12 NOTE — Evaluation (Signed)
Occupational Therapy Evaluation Patient Details Name: Lindsay Wiggins MRN: [id number] DOB: 09-Jun-1955 Today's Date: 06/12/2020    History of Present Illness Lindsay Wiggins is a 65 y.o. female with medical history significant for non-small cell lung cancer status post resection, morbid obesity history of asthma who presents to the emergency room for evaluation of shortness of breath which she has had for about 6 weeks.   Clinical Impression   Lindsay Wiggins was seen for OT evaluation this date. Pt was independent in all ADL and functional mobility, living in a 1 story townhome with husband available 24/7. Pt currently not on home O2 but pt reports becoming easily fatigued or out of breath with minimal exertion. RN cleared for activity with pt, reports pt has been ambulating in room. Pt demonstrated baseline Independence with seated ADLs and excellent dynamic standing balance for standing ADLs. Independent t/fs. Upon standing, pt reported 6/10 headache, vitals taken in standing BP 179/10, MAP 122, SpO2 94% on RA. Pt returned to bed level and vitals retaken BP 170/91, MAP 113. RN notified of HTN and pt requesting pain medicine.   Pt educated in energy conservation strategies including pursed lip breathing, activity pacing, home/routines modifications, work simplification, AE/DME, prioritizing of meaningful occupations, and falls prevention. Pt verbalized understanding and and demonstrated good carryover. No skilled acute OT needs identified, will sign off. Upon discharge, recommend CardioPulm rehab once pt is able to tolerate leaving the house (pt currently limited by poor activity tolerance and cautiousness r/t covid risk).     Follow Up Recommendations  Other (comment) (CardioPulmonary Rehab once pt can tolerate travel to [facility])    Equipment Recommendations  None recommended by OT    Recommendations for Other Services       Precautions / Restrictions Precautions Precautions:  None Restrictions Weight Bearing Restrictions: No      Mobility Bed Mobility Overal bed mobility: Modified Independent             General bed mobility comments:  (HOB elevated)    Transfers Overall transfer level: Independent                    Balance Overall balance assessment: Mild deficits observed, not formally tested                                         ADL either performed or assessed with clinical judgement   ADL Overall ADL's : Independent                                       General ADL Comments: Independent BADLs, anticipate assist required for IADLs.                  Pertinent Vitals/Pain Pain Assessment: 0-10 Pain Score: 6  Pain Location: headache Pain Descriptors / Indicators: Headache Pain Intervention(s): Patient requesting pain meds-RN notified     Hand Dominance     Extremity/Trunk Assessment Upper Extremity Assessment Upper Extremity Assessment: Overall WFL for tasks assessed   Lower Extremity Assessment Lower Extremity Assessment: Overall WFL for tasks assessed       Communication Communication Communication: No difficulties   Cognition Arousal/Alertness: Awake/alert Behavior During Therapy: WFL for tasks assessed/performed Overall Cognitive Status: Within Functional Limits for tasks assessed  General Comments  SpO2 94% on RA. Standing: BP 179/10, MAP 122. Seated: BP 170/91, MAP 113    Exercises Exercises: Other exercises Other Exercises Other Exercises: Pt educated re: OT role, DME recs, d/c recs, falls prevention, ECS, HEP, pain mgmt, breathing exercises, home/routines modifications Other Exercises: LBD, sup<>sit, sit<>stand, sitting/standing balance/tolerance, meal ordering assistance   Shoulder Instructions      Home Living Family/patient expects to be discharged to:: Private residence Living Arrangements:  Spouse/significant other Available Help at Discharge: Family;Available 24 hours/day Type of Home: House (townhome) Home Access: Stairs to enter [employer] of Steps: 1 Entrance Stairs-Rails: None Home Layout: One level     Bathroom Shower/Tub: Occupational psychologist: Handicapped height         Additional Comments: reports sleeping in a recliner 2/2 difficulty breathing in bed      Prior Functioning/Environment Level of Independence: Independent        Comments: Household ambulator, reports has not left home often 2/2 covid. Does not drive, denies falls hx        OT Problem List: Decreased activity tolerance;Cardiopulmonary status limiting activity         OT Goals(Current goals can be found in the care plan section) Acute Rehab OT Goals Patient Stated Goal: To go home with oxygen OT Goal Formulation: With patient Time For Goal Achievement: 06/26/20 Potential to Achieve Goals: Good   AM-PAC OT "6 Clicks" Daily Activity     Outcome Measure Help from another person eating meals?: None Help from another person taking care of personal grooming?: None Help from another person toileting, which includes using toliet, bedpan, or urinal?: None Help from another person bathing (including washing, rinsing, drying)?: A Little Help from another person to put on and taking off regular upper body clothing?: None Help from another person to put on and taking off regular lower body clothing?: None 6 Click Score: 23   End of Session Equipment Utilized During Treatment: Oxygen Nurse Communication: Mobility status;Patient requests pain meds  Activity Tolerance: Patient tolerated treatment well Patient left: in bed;with call bell/phone within reach  OT Visit Diagnosis: Other abnormalities of gait and mobility (R26.89)                Time: 4540-9811 OT Time Calculation (min): 30 min Charges:  OT General Charges $OT Visit: 1 Visit OT Evaluation $OT Eval  Moderate Complexity: 1 Mod OT Treatments $Self Care/Home Management : 8-22 mins $Therapeutic Activity: 8-22 mins  Dessie Coma, M.S. OTR/L  06/12/20, 1:57 PM  ascom [phone]

## 2020-06-12 NOTE — ED Notes (Signed)
OT at bedside. 

## 2020-06-12 NOTE — ED Notes (Signed)
Patient eating lunch tray at this time °

## 2020-06-12 NOTE — ED Notes (Signed)
Patient upset about being in ED since yesterday. Patient informed of lack of beds on floor. Patient requesting more comfortably bed for back. Request for bed placed at this time.

## 2020-06-12 NOTE — Progress Notes (Signed)
PT Cancellation Note  Patient Details Name: Lindsay Wiggins MRN: [id number] DOB: 05-03-1955   Cancelled Treatment:    Reason Eval/Treat Not Completed: Patient not medically ready PT orders received, chart reviewed. Spoke with OT who reports pt has HA & elevated BP with standing; exertional activity not appropriate at this time. Will f/u as able & as pt is more appropriate to participate.  Lavone Nian, PT, DPT 06/12/20, 1:02 PM    Waunita Schooner 06/12/2020, 1:01 PM

## 2020-06-13 ENCOUNTER — Inpatient Hospital Stay
Admit: 2020-06-13 | Discharge: 2020-06-13 | Disposition: A | Discharge status: Home / Self Care | Payer: Medicare PPO | Attending: Pulmonary Disease | Admitting: Pulmonary Disease

## 2020-06-13 DIAGNOSIS — J45909 Unspecified asthma, uncomplicated: Secondary | ICD-10-CM | POA: Diagnosis present

## 2020-06-13 DIAGNOSIS — J4541 Moderate persistent asthma with (acute) exacerbation: Secondary | ICD-10-CM | POA: Diagnosis not present

## 2020-06-13 LAB — CBC
HCT: 43 % (ref 36.0–46.0)
Hemoglobin: 13.7 g/dL (ref 12.0–15.0)
MCH: 29 pg (ref 26.0–34.0)
MCHC: 31.9 g/dL (ref 30.0–36.0)
MCV: 90.9 fL (ref 80.0–100.0)
Platelets: 239 10*3/uL (ref 150–400)
RBC: 4.73 MIL/uL (ref 3.87–5.11)
RDW: 13.3 % (ref 11.5–15.5)
WBC: 11.7 10*3/uL — ABNORMAL HIGH (ref 4.0–10.5)
nRBC: 0 % (ref 0.0–0.2)

## 2020-06-13 LAB — BASIC METABOLIC PANEL
Anion gap: 9 (ref 5–15)
BUN: 28 mg/dL — ABNORMAL HIGH (ref 8–23)
CO2: 26 mmol/L (ref 22–32)
Calcium: 8.8 mg/dL — ABNORMAL LOW (ref 8.9–10.3)
Chloride: 105 mmol/L (ref 98–111)
Creatinine, Ser: 0.91 mg/dL (ref 0.44–1.00)
GFR, Estimated: >60 mL/min (ref >60)
Glucose, Bld: 178 mg/dL — ABNORMAL HIGH (ref 70–99)
Potassium: 4.8 mmol/L (ref 3.5–5.1)
Sodium: 140 mmol/L (ref 135–145)

## 2020-06-13 LAB — ECHOCARDIOGRAM COMPLETE
AR max vel: 2.01 cm2
AV Area VTI: 2.64 cm2
AV Area mean vel: 2.1 cm2
AV Mean grad: 4 mmHg
AV Peak grad: 7.1 mmHg
Ao pk vel: 1.33 m/s
Area-P 1/2: 5.16 cm2
Height: 63 in
S' Lateral: 2.45 cm
Weight: 4000 oz

## 2020-06-13 LAB — HEMOGLOBIN A1C
Hgb A1c MFr Bld: 5.6 % (ref 4.8–5.6)
Mean Plasma Glucose: 114 mg/dL

## 2020-06-13 LAB — MYCOPLASMA PNEUMONIAE ANTIBODY, IGM: Mycoplasma pneumo IgM: <770 U/mL (ref 0–769)

## 2020-06-13 MED ORDER — AZITHROMYCIN 250 MG PO TABS: ORAL_TABLET | ORAL | 0 refills | Status: AC

## 2020-06-13 MED ORDER — PREDNISONE 20 MG PO TABS: 40.0000 mg | ORAL_TABLET | Freq: Every day | ORAL | 0 refills | Status: AC

## 2020-06-13 MED ORDER — FAMOTIDINE 20 MG PO TABS: 20.0000 mg | ORAL_TABLET | Freq: Two times a day (BID) | ORAL | 0 refills | Status: AC

## 2020-06-13 MED ORDER — ACETAMINOPHEN 325 MG PO TABS
650.0000 mg | ORAL_TABLET | Freq: Four times a day (QID) | ORAL | Status: DC | PRN
  Administered 2020-06-13: 650 mg via ORAL
  Filled 2020-06-13: qty 2

## 2020-06-13 NOTE — Progress Notes (Signed)
Met with the patient to discuss DC plan and needs She lives at home with her husband She has several cats at home and has asthma, she is animate about wanting to go home with Oxygen, she is sating above 90 while walking, I explained that insurance will not cover the cost of Oxygen if not dropping, she requested that I check to see about the cost without insurance, I called and spoke to [place] at [facility] and got the price for Oxygen without insurance it is 250-300 per month, I notified the patient of the cost, Awiting to see if the doctor feels that she will need oxygen to go home or not

## 2020-06-13 NOTE — Progress Notes (Signed)
Despite Oxygen Saturation, Patient still requires oxygen

## 2020-06-13 NOTE — Progress Notes (Signed)
Initial Nutrition Assessment  DOCUMENTATION CODES:  Obesity unspecified  INTERVENTION:   Continue current diet as ordered. Consider liberalization if intake trends <75% intake consistently.  NUTRITION DIAGNOSIS:  Increased nutrient needs related to acute illness as evidenced by estimated needs.  GOAL:  Patient will meet greater than or equal to 90% of their needs  MONITOR:  PO intake  REASON FOR ASSESSMENT:  Consult Assessment of nutrition requirement/status  ASSESSMENT:  Pt admitted for SOB ongoing over the last 6 weeks despite two steroid tapers. PMH includes lung cancer, s/p resection 2010.    Pt resting in bedside chair at the time of assessment. Pt reports that she is being discharged today. Inquired about intake. Pt reports that SOB has not impacted her ability to eat and that has not lost any weight. States her SOB tends to be more with activity and is ok when she is at rest. Pt reports she has gained weight recently due to her steroid use. No GI complaints at this time.   Nutritionally Relevant Meds: . pantoprazole  40 mg Oral Daily  . predniSONE  40 mg Oral Q breakfast   PRN Meds: alum & mag hydroxide-simeth, magnesium oxide  Labs reviewed:  BUN 28  SBG ranges from 130-178 mg/dL in the last 24 hours.  NUTRITION - FOCUSED PHYSICAL EXAM: Pt being discharged, deferred  Diet Order:   Diet Order            Diet - low sodium heart healthy           Diet 2 gram sodium Room service appropriate? Yes; Fluid consistency: Thin  Diet effective now                EDUCATION NEEDS:  No education needs have been identified at this time  Skin:  Skin Assessment: Reviewed RN Assessment  Last BM:  unsure  Height:  Ht Readings from Last 1 Encounters:  06/11/20 5\' 3"  (1.6 m)    Weight:  Wt Readings from Last 1 Encounters:  06/11/20 113.4 kg    Ideal Body Weight:  52.3 kg  BMI:  Body mass index is 44.29 kg/m.  Estimated Nutritional Needs:   Kcal:   1500-1700 kcal  Protein:  75-85 g  Fluid:  >1.5L per day   Ranell Patrick, RD, LDN Clinical Dietitian Pager on [place]

## 2020-06-13 NOTE — Discharge Summary (Signed)
[facility] Discharge Summary   Patient: Amantha Sklar ZOX:[id number]  PCP: Dereck Ligas, DO  Date of admission: 06/11/2020   Date of discharge:  06/13/2020     Discharge Diagnoses:  Principal Problem:   Asthma exacerbation Active Problems:   Obesity, Class III, BMI 40-49.9 (morbid obesity) ([place])   Elevated blood pressure reading without diagnosis of hypertension   Asthma   Admitted From: Home Disposition:  Home   Recommendations for Outpatient Follow-up:  1. PCP: in 1 wk 2. F/u Pulmo in 1 wk 3. Follow up LABS/TEST:     Diet recommendation: Cardiac diet  Activity: The patient is advised to gradually reintroduce usual activities, as tolerated  Discharge Condition: stable  Code Status: Full code   History of present illness: As per the H and P dictated on admission Hospital Course:  Donnesha Bernasconi is a 65 y.o. female with medical history significant for non-small cell lung cancer status post resection, morbid obesity history of asthma who presents to the emergency room for evaluation of shortness of breath which she has had for about 6 weeks. Patient has received 2 courses of steroid taper over the last 6 weeks with transient improvement in her symptoms and then recurrence.  Last taper was about 3 days prior to this admission. She has had multiple COVID tests as well as PCR testing for RSV which came back negative. Shortness of breath is mostly with exertion and associated with chest tightness and occasional dry cough.  She has mild lower extremity swelling and is unable to lay flat in bed.  ED work-up was unremarkable.  She was ambulated prior to discharge and her  post ambulatory pulse ox was 88% on room air with associated tachypnea.  Patient is currently on 2 L of oxygen via nasal cannula. She will be admitted to the hospital for further evaluation. Assessment and plan # Acute asthma exacerbation, Patient presents with a 6-week history of exertional shortness of breath  worse when compared to her baseline associated with a nonproductive cough. She has received 2 courses of steroid taper over the last 6 weeks with transient improvement in her symptoms. Patient received DuoNeb in the ER as well as systemic steroids and prior to discharge was ambulated in the emergency room with post ambulatory pulse ox of 88%, placed on 2 L oxygen via nasal cannula and O2 saturation was greater than 90%.  Patient was kept on IV Solu-Medrol, transition to oral prednisone in the morning.  Patient was saturating well on room air in the morning, oxygen was discontinued but due to persistent symptoms of asthma recurrence for previous 6 weeks we arrange oxygen for home use due to high risk for hypoxia.  Started prednisone oral for few days and Z-Pak for possibility of underlying atypical infection.  Pt was seen by pulmonologist during hospital stay.  Patient seems to be stable, recommended to follow with PCP and pulmonology as an outpatient # Morbid obesity, Body mass index is 44.29 kg/m.  Complicates overall prognosis and care Nutrition Interventions: Calorie restricted diet and daily exercise advised to lose body weight   Patient was ambulatory without any assistance. On the day of the discharge the patient's vitals were stable, and no other acute medical condition were reported by patient. the patient was felt safe to be discharge at Home  Consultants: Pulmo Procedures: None  Discharge Exam: General: Appear in no distress, no Rash; Oral Mucosa Clear, moist. Cardiovascular: S1 and S2 Present, no Murmur, Respiratory: normal respiratory effort, Bilateral Air  entry present and no Crackles, no wheezes Abdomen: Bowel Sound present, Soft and no tenderness, no hernia Extremities: no Pedal edema, no calf tenderness Neurology: alert and oriented to time, place, and person affect appropriate.  Filed Weights   06/11/20 2133  Weight: 113.4 kg   Vitals:   06/13/20 0825 06/13/20 0904  BP:     Pulse:    Resp:    Temp:    SpO2: 100% 96%    DISCHARGE MEDICATION: Allergies as of 06/13/2020      Reactions   Penicillins Shortness Of Breath      Medication List    TAKE these medications   AeroChamber MV inhaler Use as instructed   albuterol 108 (90 Base) MCG/ACT inhaler Commonly known as: VENTOLIN HFA Inhale 2 puffs into the lungs every 4 (four) hours as needed for wheezing or shortness of breath.   azithromycin 250 MG tablet Commonly known as: Zithromax Z-Pak Take 2 tablets (500 mg) on  Day 1,  followed by 1 tablet (250 mg) once daily on Days 2 through 5.   cyclobenzaprine 10 MG tablet Commonly known as: FLEXERIL Take 10 mg by mouth at bedtime as needed for muscle spasms.   famotidine 20 MG tablet Commonly known as: PEPCID Take 1 tablet (20 mg total) by mouth 2 (two) times daily for 7 days.   Fluticasone-Salmeterol 500-50 MCG/DOSE Aepb Commonly known as: ADVAIR Inhale 1 puff into the lungs 2 (two) times daily.   levalbuterol 1.25 MG/3ML nebulizer solution Commonly known as: XOPENEX Take 1.25 mg by nebulization every 6 (six) hours as needed for shortness of breath or wheezing.   magnesium oxide 400 MG tablet Commonly known as: MAG-OX Take 800 mg by mouth daily as needed (leg cramps).   montelukast 10 MG tablet Commonly known as: SINGULAIR Take 10 mg by mouth at bedtime.   predniSONE 20 MG tablet Commonly known as: DELTASONE Take 2 tablets (40 mg total) by mouth daily with breakfast for 4 days. Start taking on: June 14, 2020            Durable Medical Equipment  (From admission, onward)         Start     Ordered   06/13/20 1355  For home use only DME oxygen  Once       Comments: Despite O2 saturation patient still requires oxygen.  Having symptoms for more than 2 weeks and can have another attack of asthma so she will benefit from oxygen arrangement at home.  Question Answer Comment  Length of Need 6 Months   Mode or (Route) Nasal cannula    Liters per Minute 2   Oxygen delivery system Gas      06/13/20 1357   06/13/20 1341  For home use only DME oxygen  Once       Comments: Despite Oxygen Saturations, Patient still requires Oxygen  Question Answer Comment  Length of Need 6 Months   Mode or (Route) Nasal cannula   Liters per Minute 2   Frequency Continuous (stationary and portable oxygen unit needed)   Oxygen conserving device No   Oxygen delivery system Gas      06/13/20 1341         Allergies  Allergen Reactions  . Penicillins Shortness Of Breath   Discharge Instructions    Call MD for:  difficulty breathing, headache or visual disturbances   Complete by: As directed    Call MD for:  extreme fatigue   Complete by: As  directed    Call MD for:  persistant dizziness or light-headedness   Complete by: As directed    Call MD for:  persistant nausea and vomiting   Complete by: As directed    Call MD for:  severe uncontrolled pain   Complete by: As directed    Call MD for:  temperature >100.4   Complete by: As directed    Diet - low sodium heart healthy   Complete by: As directed    Discharge instructions   Complete by: As directed    Follow with PCP in 3 to 5 days Follow with pulmonologist in 1 week   Increase activity slowly   Complete by: As directed       The results of significant diagnostics from this hospitalization (including imaging, microbiology, ancillary and laboratory) are listed below for reference.    Significant Diagnostic Studies: DG Chest 2 View  Result Date: 06/11/2020 CLINICAL DATA:  Shortness of breath EXAM: CHEST - 2 VIEW COMPARISON:  04/04/2018 FINDINGS: Lingular/left base atelectasis or scarring. Right lung clear. Heart is normal size. No acute bony abnormality or effusions. IMPRESSION: Left base atelectasis or scarring. Electronically Signed   By: Rolm Baptise M.D.   On: 06/11/2020 22:22   CT Angio Chest PE W/Cm &/Or Wo Cm  Result Date: 06/12/2020 CLINICAL DATA:  Shortness of  breath for 6 weeks, PE suspected, EXAM: CT ANGIOGRAPHY CHEST WITH CONTRAST TECHNIQUE: Multidetector CT imaging of the chest was performed using the standard protocol during bolus administration of intravenous contrast. Multiplanar CT image reconstructions and MIPs were obtained to evaluate the vascular anatomy. CONTRAST:  121mL OMNIPAQUE IOHEXOL 350 MG/ML SOLN COMPARISON:  Radiograph 04/04/2018, 06/11/2020 FINDINGS: Cardiovascular: While there is satisfactory opacification of the pulmonary arteries, examination is significantly limited by extensive respiratory motion artifact. No large central filling defects are identified. Some hypoattenuation in the segmental branches of the right lower lobe are favored to be motion artifact related particularly given the negative D-dimer. Central pulmonary arteries are normal caliber. Normal heart size. No pericardial effusion. Coronary artery calcifications are seen. Few calcifications on the aortic leaflets. The aortic root is suboptimally assessed given cardiac pulsation artifact. Atherosclerotic plaque within the normal caliber aorta. No acute luminal abnormality of the imaged aorta. No periaortic stranding or hemorrhage. Normal 3 vessel branching of the aortic arch. Proximal great vessels are unremarkable. Mediastinum/Nodes: No mediastinal fluid or gas. Normal thyroid gland and thoracic inlet. No acute abnormality of the trachea or esophagus. No worrisome mediastinal, hilar or axillary adenopathy. Lungs/Pleura: Postsurgical changes are seen in the right lung base compatible with history of partial right lower lobectomy. Some diffuse mild dependent atelectatic changes, possibly accentuated by imaging during exhalation for the angiographic technique. Some additional bandlike areas of opacity in the lung bases may reflect further volume loss or scarring. No consolidation, features of edema, pneumothorax, or effusion. No suspicious pulmonary nodules or masses. Upper Abdomen:  Prior cholecystectomy. No acute abnormalities present in the visualized portions of the upper abdomen. Musculoskeletal: Exaggerated thoracic kyphosis with multilevel discogenic and facet degenerative changes. Some mild remote anterior wedging of the T6, T7 vertebrae is grossly unchanged from comparison radiography. Associated chest wall deformity with exaggerated AP diameter. No acute or conspicuous osseous lesions. No worrisome chest wall masses or lesions. Review of the MIP images confirms the above findings. IMPRESSION: 1. While there is satisfactory opacification of the pulmonary arteries, examination is significantly limited by extensive respiratory motion artifact. No large central filling defects are identified. Some hypoattenuation  about the segmental branches of the right lower lobe are favored to be motion artifact related particularly given the negative D-dimer. 2. Basilar and dependent atelectatic changes. No other acute intrathoracic process. 3. Postsurgical changes in the right lung base compatible with history of partial right lower lobectomy. 4. Mild remote anterior wedging of the T6, T7 vertebrae is favored to be chronic/remote. Associated chest wall deformity with increased AP diameter of the chest. 5. Aortic Atherosclerosis (ICD10-I70.0). Electronically Signed   By: Lovena Le M.D.   On: 06/12/2020 03:40   ECHOCARDIOGRAM COMPLETE  Result Date: 06/13/2020    ECHOCARDIOGRAM REPORT   Patient Name:   BRIANA FARNER Date of Exam: 06/13/2020 Medical Rec #:  [id number]      Height:       63.0 in Accession #:    [id number]     Weight:       250.0 lb Date of Birth:  1955-06-30       BSA:          2.126 m Patient Age:    62 years       BP:           142/80 mmHg Patient Gender: F              HR:           89 bpm. Exam Location:  [facility] Procedure: 2D Echo, Cardiac Doppler and Color Doppler Indications:     Mitral valve stenosis I34.2  History:         Patient has no prior history of Echocardiogram  examinations. No                  cardiac history listed in chart.  Sonographer:     Sherrie Sport RDCS (AE) Referring Phys:  [id number] Ottie Glazier Diagnosing Phys: Bartholome Bill MD  Sonographer Comments: Technically challenging study due to limited acoustic windows, suboptimal apical window and suboptimal parasternal window. IMPRESSIONS  1. Left ventricular ejection fraction, by estimation, is 55 to 60%. The left ventricle has normal function. The left ventricle has no regional wall motion abnormalities. There is mild left ventricular hypertrophy. Left ventricular diastolic parameters are consistent with Grade I diastolic dysfunction (impaired relaxation).  2. Right ventricular systolic function was not well visualized. The right ventricular size is not well visualized.  3. The mitral valve was not well visualized. Trivial mitral valve regurgitation.  4. The aortic valve was not well visualized. Aortic valve regurgitation is not visualized. FINDINGS  Left Ventricle: Left ventricular ejection fraction, by estimation, is 55 to 60%. The left ventricle has normal function. The left ventricle has no regional wall motion abnormalities. The left ventricular internal cavity size was normal in size. There is  mild left ventricular hypertrophy. Left ventricular diastolic parameters are consistent with Grade I diastolic dysfunction (impaired relaxation). Right Ventricle: The right ventricular size is not well visualized. Right vetricular wall thickness was not well visualized. Right ventricular systolic function was not well visualized. Left Atrium: Left atrial size was normal in size. Right Atrium: Right atrial size was normal in size. Pericardium: There is no evidence of pericardial effusion. Mitral Valve: The mitral valve was not well visualized. Trivial mitral valve regurgitation. Tricuspid Valve: The tricuspid valve is not well visualized. Tricuspid valve regurgitation is trivial. Aortic Valve: The aortic valve was not well  visualized. Aortic valve regurgitation is not visualized. Aortic valve mean gradient measures 4.0 mmHg. Aortic valve peak gradient measures 7.1 mmHg. Aortic valve area, by  VTI measures 2.64 cm. Pulmonic Valve: The pulmonic valve was not assessed. Pulmonic valve regurgitation is not visualized. Aorta: The aortic root was not well visualized. IAS/Shunts: The interatrial septum was not well visualized.  LEFT VENTRICLE PLAX 2D LVIDd:         3.57 cm  Diastology LVIDs:         2.45 cm  LV e' medial:    7.07 cm/s LV PW:         1.05 cm  LV E/e' medial:  10.7 LV IVS:        0.84 cm  LV e' lateral:   8.05 cm/s LVOT diam:     2.00 cm  LV E/e' lateral: 9.4 LV SV:         61 LV SV Index:   29 LVOT Area:     3.14 cm  RIGHT VENTRICLE RV Basal diam:  3.42 cm RV S prime:     12.50 cm/s TAPSE (M-mode): 3.0 cm LEFT ATRIUM             Index       RIGHT ATRIUM           Index LA diam:        2.30 cm 1.08 cm/m  RA Area:     10.40 cm LA Vol (A2C):   30.7 ml 14.44 ml/m RA Volume:   23.20 ml  10.91 ml/m LA Vol (A4C):   20.4 ml 9.60 ml/m LA Biplane Vol: 26.4 ml 12.42 ml/m  AORTIC VALVE                   PULMONIC VALVE AV Area (Vmax):    2.01 cm    PV Vmax:        0.87 m/s AV Area (Vmean):   2.10 cm    PV Peak grad:   3.0 mmHg AV Area (VTI):     2.64 cm    RVOT Peak grad: 3 mmHg AV Vmax:           133.00 cm/s AV Vmean:          92.600 cm/s AV VTI:            0.230 m AV Peak Grad:      7.1 mmHg AV Mean Grad:      4.0 mmHg LVOT Vmax:         84.90 cm/s LVOT Vmean:        61.900 cm/s LVOT VTI:          0.193 m LVOT/AV VTI ratio: 0.84  AORTA Ao Root diam: 2.50 cm MITRAL VALVE                TRICUSPID VALVE MV Area (PHT): 5.16 cm     TR Peak grad:   17.6 mmHg MV Decel Time: 147 msec     TR Vmax:        210.00 cm/s MV E velocity: 75.80 cm/s MV A velocity: 114.00 cm/s  SHUNTS MV E/A ratio:  0.66         Systemic VTI:  0.19 m                             Systemic Diam: 2.00 cm Bartholome Bill MD Electronically signed by Bartholome Bill MD  Signature Date/Time: 06/13/2020/10:14:25 AM    Final     Microbiology: Recent Results (from the past 240 hour(s))  Resp Panel by RT-PCR (Flu  A&B, Covid) Nasopharyngeal Swab     Status: None   Collection Time: 06/12/20  2:04 AM   Specimen: Nasopharyngeal Swab; Nasopharyngeal(NP) swabs in vial transport medium  Result Value Ref Range Status   SARS Coronavirus 2 by RT PCR NEGATIVE NEGATIVE Final    Comment: (NOTE) SARS-CoV-2 target nucleic acids are NOT DETECTED.  The SARS-CoV-2 RNA is generally detectable in upper respiratory specimens during the acute phase of infection. The lowest concentration of SARS-CoV-2 viral copies this assay can detect is 138 copies/mL. A negative result does not preclude SARS-Cov-2 infection and should not be used as the sole basis for treatment or other patient management decisions. A negative result may occur with  improper specimen collection/handling, submission of specimen other than nasopharyngeal swab, presence of viral mutation(s) within the areas targeted by this assay, and inadequate number of viral copies(<138 copies/mL). A negative result must be combined with clinical observations, patient history, and epidemiological information. The expected result is Negative.  Fact Sheet for Patients:  EntrepreneurPulse.com.au  Fact Sheet for Healthcare Providers:  IncredibleEmployment.be  This test is no t yet approved or cleared by the [place] FDA and  has been authorized for detection and/or diagnosis of SARS-CoV-2 by FDA under an Emergency Use Authorization (EUA). This EUA will remain  in effect (meaning this test can be used) for the duration of the COVID-19 declaration under Section 564(b)(1) of the Act, 21 U.S.C.section 360bbb-3(b)(1), unless the authorization is terminated  or revoked sooner.       Influenza A by PCR NEGATIVE NEGATIVE Final   Influenza B by PCR NEGATIVE NEGATIVE Final    Comment:  (NOTE) The Xpert Xpress SARS-CoV-2/FLU/RSV plus assay is intended as an aid in the diagnosis of influenza from Nasopharyngeal swab specimens and should not be used as a sole basis for treatment. Nasal washings and aspirates are unacceptable for Xpert Xpress SARS-CoV-2/FLU/RSV testing.  Fact Sheet for Patients: EntrepreneurPulse.com.au  Fact Sheet for Healthcare Providers: IncredibleEmployment.be  This test is not yet approved or cleared by the [place] FDA and has been authorized for detection and/or diagnosis of SARS-CoV-2 by FDA under an Emergency Use Authorization (EUA). This EUA will remain in effect (meaning this test can be used) for the duration of the COVID-19 declaration under Section 564(b)(1) of the Act, 21 U.S.C. section 360bbb-3(b)(1), unless the authorization is terminated or revoked.  Performed at [facility], [place], [place] [address]   Respiratory (~20 pathogens) panel by PCR     Status: None   Collection Time: 06/12/20  1:09 PM   Specimen: Nasopharyngeal Swab; Respiratory  Result Value Ref Range Status   Adenovirus NOT DETECTED NOT DETECTED Final   Coronavirus 229E NOT DETECTED NOT DETECTED Final    Comment: (NOTE) The Coronavirus on the Respiratory Panel, DOES NOT test for the novel  Coronavirus (2019 nCoV)    Coronavirus HKU1 NOT DETECTED NOT DETECTED Final   Coronavirus NL63 NOT DETECTED NOT DETECTED Final   Coronavirus OC43 NOT DETECTED NOT DETECTED Final   Metapneumovirus NOT DETECTED NOT DETECTED Final   Rhinovirus / Enterovirus NOT DETECTED NOT DETECTED Final   Influenza A NOT DETECTED NOT DETECTED Final   Influenza B NOT DETECTED NOT DETECTED Final   Parainfluenza Virus 1 NOT DETECTED NOT DETECTED Final   Parainfluenza Virus 2 NOT DETECTED NOT DETECTED Final   Parainfluenza Virus 3 NOT DETECTED NOT DETECTED Final   Parainfluenza Virus 4 NOT DETECTED NOT DETECTED Final    Respiratory Syncytial Virus  NOT DETECTED NOT DETECTED Final   Bordetella pertussis NOT DETECTED NOT DETECTED Final   Bordetella Parapertussis NOT DETECTED NOT DETECTED Final   Chlamydophila pneumoniae NOT DETECTED NOT DETECTED Final   Mycoplasma pneumoniae NOT DETECTED NOT DETECTED Final    Comment: Performed at [facility], [place] [address], [place], [place] [address]  MRSA PCR Screening     Status: None   Collection Time: 06/12/20  1:09 PM   Specimen: Nasopharyngeal  Result Value Ref Range Status   MRSA by PCR NEGATIVE NEGATIVE Final    Comment:        The GeneXpert MRSA Assay (FDA approved for NASAL specimens only), is one component of a comprehensive MRSA colonization surveillance program. It is not intended to diagnose MRSA infection nor to guide or monitor treatment for MRSA infections. Performed at [facility], [address] [place], [place] [address]      Labs: CBC: Recent Labs  Lab 06/11/20 2338 06/13/20 0333  WBC 9.2 11.7*  NEUTROABS 5.8  --   HGB 14.0 13.7  HCT 45.5 43.0  MCV 92.7 90.9  PLT 227 673   Basic Metabolic Panel: Recent Labs  Lab 06/12/20 0204 06/13/20 0333  NA 139 140  K 4.2 4.8  CL 103 105  CO2 25 26  GLUCOSE 130* 178*  BUN 27* 28*  CREATININE 0.99 0.91  CALCIUM 8.8* 8.8*   Liver Function Tests: Recent Labs  Lab 06/12/20 0204  AST 30  ALT 26  ALKPHOS 87  BILITOT 0.6  PROT 7.0  ALBUMIN 3.8   No results for input(s): LIPASE, AMYLASE in the last 168 hours. No results for input(s): AMMONIA in the last 168 hours. Cardiac Enzymes: No results for input(s): CKTOTAL, CKMB, CKMBINDEX, TROPONINI in the last 168 hours. BNP (last 3 results) Recent Labs    06/11/20 2338  BNP 13.6   CBG: No results for input(s): GLUCAP in the last 168 hours.  Time spent: 35 minutes  Signed:  Val Riles  [facility]  06/13/2020 2:26 PM

## 2020-06-13 NOTE — TOC Progression Note (Signed)
Transition of Care [facility]) - Progression Note    Patient Details  Name: Lindsay Wiggins MRN: [id number] Date of Birth: 1955-09-10  Transition of Care [facility]) CM/SW [place], RN Phone Number: 06/13/2020, 12:11 PM  Clinical Narrative:   Spoke with the physician and she will DC today with oxygen   Expected Discharge Plan: Home/Self Care Barriers to Discharge: Continued Medical Work up  Expected Discharge Plan and Services Expected Discharge Plan: Home/Self Care   Discharge Planning Services: CM Consult   Living arrangements for the past 2 months: Single Family Home                 DME Arranged: Brace, Neck,Oxygen DME Agency: [employer] Date DME Agency Contacted: 06/13/20 Time DME Agency Contacted: 28 Representative spoke with at DME Agency: Zack             Social Determinants of Health ([place]) Interventions    Readmission Risk Interventions No flowsheet data found.

## 2020-06-13 NOTE — Progress Notes (Signed)
*  PRELIMINARY RESULTS* Echocardiogram 2D Echocardiogram has been performed.  Lindsay Wiggins 06/13/2020, 9:03 AM

## 2020-06-13 NOTE — Care Management CC44 (Signed)
Condition Code 44 Documentation Completed  Patient Details  Name: Lindsay Wiggins MRN: [id number] Date of Birth: 1955-08-31   Condition Code 44 given:  Yes Patient signature on Condition Code 44 notice:  Yes Documentation of 2 MD's agreement:  Yes Code 44 added to claim:  Yes    Su Hilt, RN 06/13/2020, 2:48 PM

## 2020-06-13 NOTE — Evaluation (Signed)
Physical Therapy Evaluation Patient Details Name: Lindsay Wiggins MRN: [id number] DOB: May 09, 1955 Today's Date: 06/13/2020   History of Present Illness  Pt is a 65 y.o. female presenting to hospital 4/16 with SOB x6 weeks; h/o lung CA s/p lung resection.  Pt admitted with recurrent respiratory distress likely d/t acute asthma exacerbation.  PMH includes non-small cell lung CA with most of R lung resected and asthma.  Clinical Impression  Prior to hospital admission, pt was independent with ambulation; lives with her husband in 1 level home with 1 STE with railing.  Currently pt is modified independent with bed mobility, independent with transfers, and SBA ambulating 150 feet (no AD).  Pt moderately SOB post ambulation--O2 sats 90% or greater on room air with ambulation trial (O2 sats 93-94% at rest end of session).  Pt demonstrating generalized weakness and decreased activity tolerance compared to baseline.  Pt would benefit from skilled PT to address noted impairments and functional limitations (see below for any additional details).  Upon hospital discharge, pt would benefit from HHPT to improve overall strength and activity tolerance with daily activities (pt is interested in progressing to a pulmonary rehab program after that).    Follow Up Recommendations Home health PT    Equipment Recommendations  None recommended by PT    Recommendations for Other Services       Precautions / Restrictions Precautions Precautions: None Restrictions Weight Bearing Restrictions: No      Mobility  Bed Mobility Overal bed mobility: Modified Independent             General bed mobility comments: Semi-supine to/from sitting edge of bed with mild increased effort    Transfers Overall transfer level: Independent Equipment used: None             General transfer comment: steady safe transfers noted  Ambulation/Gait Ambulation/Gait assistance: Supervision Gait Distance (Feet): 150  Feet Assistive device: None   Gait velocity: decreased   General Gait Details: mild increased BOS with mild decreased B LE step length; mild increased B lateral sway; no loss of balance  Stairs            Wheelchair Mobility    Modified Rankin (Stroke Patients Only)       Balance Overall balance assessment: Needs assistance Sitting-balance support: No upper extremity supported;Feet supported Sitting balance-Leahy Scale: Normal Sitting balance - Comments: steady sitting putting on socks B       Standing balance comment: no loss of balance standing reaching within BOS                             Pertinent Vitals/Pain Pain Assessment: 0-10 Pain Score: 3  Pain Location: headache Pain Descriptors / Indicators: Headache Pain Intervention(s): Limited activity within patient's tolerance;Monitored during session;Premedicated before session;Repositioned  HR 101 bpm at rest and increased to 113 bpm with activity    Home Living Family/patient expects to be discharged to:: Private residence Living Arrangements: Spouse/significant other Available Help at Discharge: Family;Available 24 hours/day Type of Home: House ([place]) Home Access: Stairs to enter Entrance Stairs-Rails: Right Entrance Stairs-Number of Steps: 1 Home Layout: One level Home Equipment: None Additional Comments: pt reports sleeping in a recliner d/t difficulty breathing in bed    Prior Function Level of Independence: Independent         Comments: Ambulates household distances; does not leave home often d/t COVID.  No falls in past 6 months.  Hand Dominance        Extremity/Trunk Assessment   Upper Extremity Assessment Upper Extremity Assessment: Defer to OT evaluation    Lower Extremity Assessment Lower Extremity Assessment: Generalized weakness       Communication   Communication: No difficulties  Cognition Arousal/Alertness: Awake/alert Behavior During Therapy: WFL  for tasks assessed/performed Overall Cognitive Status: Within Functional Limits for tasks assessed                                        General Comments   Nursing cleared pt for participation in physical therapy.  Pt agreeable to PT session.    Exercises     Assessment/Plan    PT Assessment Patient needs continued PT services  PT Problem List Decreased strength;Decreased activity tolerance;Decreased mobility;Cardiopulmonary status limiting activity       PT Treatment Interventions DME instruction;Gait training;Stair training;Functional mobility training;Therapeutic activities;Therapeutic exercise;Balance training;Patient/family education    PT Goals (Current goals can be found in the Care Plan section)  Acute Rehab PT Goals Patient Stated Goal: to go home soon PT Goal Formulation: With patient Time For Goal Achievement: 06/27/20 Potential to Achieve Goals: Good    Frequency Min 2X/week   Barriers to discharge        Co-evaluation               AM-PAC PT "6 Clicks" Mobility  Outcome Measure Help needed turning from your back to your side while in a flat bed without using bedrails?: None Help needed moving from lying on your back to sitting on the side of a flat bed without using bedrails?: None Help needed moving to and from a bed to a chair (including a wheelchair)?: None Help needed standing up from a chair using your arms (e.g., wheelchair or bedside chair)?: None Help needed to walk in hospital room?: A Little Help needed climbing 3-5 steps with a railing? : A Little 6 Click Score: 22    End of Session Equipment Utilized During Treatment: Gait belt Activity Tolerance: Patient tolerated treatment well Patient left: in chair;with call bell/phone within reach Nurse Communication: Mobility status;Precautions;Other (comment) (pt's O2 sats during session) PT Visit Diagnosis: Other abnormalities of gait and mobility (R26.89);Muscle weakness  (generalized) (M62.81)    Time: 0865-7846 PT Time Calculation (min) (ACUTE ONLY): 25 min   Charges:   PT Evaluation $PT Eval Low Complexity: 1 Low         Shynia Daleo, PT 06/13/20, 10:15 AM

## 2020-06-13 NOTE — Progress Notes (Signed)
   06/12/20 2206  Assess: MEWS Score  Temp 97.8 F (36.6 C)  BP (!) 152/75  Pulse Rate (!) 114  Resp 19  SpO2 98 %  O2 Device Nasal Cannula  O2 Flow Rate (L/min) 2 L/min  Assess: MEWS Score  MEWS Temp 0  MEWS Systolic 0  MEWS Pulse 2  MEWS RR 0  MEWS LOC 0  MEWS Score 2  MEWS Score Color Yellow  Assess: if the MEWS score is Yellow or Red  Were vital signs taken at a resting state? No (pt had just gotten to the floor and had walked from the ER gurney)  Focused Assessment No change from prior assessment  Early Detection of Sepsis Score *See Row Information* Low  MEWS guidelines implemented *See Row Information* No, previously yellow, continue vital signs every 4 hours  Treat  MEWS Interventions Other (Comment) (gave pt time to settle)  Pain Scale 0-10  Pain Score 0  Notify: Charge Nurse/RN  Name of Charge Nurse/RN Notified Nakay, RN  Document  Progress note created (see row info) Yes   Pt has been on steroids for last 6 weeks and it has increased her BP and heart rate.

## 2020-06-13 NOTE — Progress Notes (Signed)
Pulmonary Medicine          Date: 06/13/2020,   MRN# [id number] Lindsay Wiggins 23-Oct-1955     AdmissionWeight: 113.4 kg                 CurrentWeight: 113.4 kg   Referring physician: Dr Joylene Igo   CHIEF COMPLAINT:   Recurrent respiratory distress in context of Asthma with hx of lung cancer.   HISTORY OF PRESENT ILLNESS   65 yo F hx of NSCLC post resection, obesity, hx of chronic asthma.  She has had chronic cough and required multiple rounds of systemic steroids in past few months. She had finished most recent taper few days prior to admission. She is negative for COVID and RSV. She has chronic mild LE edema. She had blood work done with essentially normal BMP and CBC. She had BNP done which was normal.  She had CXR and CTPE done while in ER. I reviewed imaging myself.  She is s/p resection partial right lower lobectomy. She has mild bibasilar atelectasis and mild bronchitic changes diffusely. There was no PE found on this study but motion artifact hindered evaluation of smaller vessels. Imporantly there is absence of infiltrate/consolidation, mass, edema, effusion, pneumothorax, pmeumomediastinum, possible fibrotic changes noted worse at bases posteriorly which may be due to atelectatic changes/obese body habitus. She was hypertensive and tachycardiac throughout admission however normoxic on room air. She sees [facility] for chronic asthma with cough variant, was noted to have elvation of ddimer recently was on tapering burst of prednisone.   06/13/20- Patient is improved, she had required 2L/min supplemental O2 while in asthma exacerbation. She normoxic now and is not wheezing after IV steroids. Resp viral panel is normal, PCT is within reference range indicative of no bacterial pneumonia. She still has additional testing for ABPA, churg strause/EGPA, autoimmune dz, fungal workup and HP panle that is pending. She may be dcd with medrol dose pack and zithromax 250mg  x 5 d and follow up with  her pulmonologist on outpatient.    PAST MEDICAL HISTORY   Past Medical History:  Diagnosis Date  . Asthma   . Cancer (HCC) 2010   lung ca- most of rt lung resected     SURGICAL HISTORY   Past Surgical History:  Procedure Laterality Date  . ABDOMINAL HYSTERECTOMY    . LUNG LOBECTOMY Right      FAMILY HISTORY   Family History  Problem Relation Age of Onset  . Breast cancer Neg Hx      SOCIAL HISTORY   Social History   Tobacco Use  . Smoking status: Never Smoker  . Smokeless tobacco: Never Used  Vaping Use  . Vaping Use: Never used  Substance Use Topics  . Alcohol use: Yes     MEDICATIONS    Home Medication:    Current Medication:  Current Facility-Administered Medications:  .  0.9 %  sodium chloride infusion, 250 mL, Intravenous, PRN, Agbata, Tochukwu, MD .  acetaminophen (TYLENOL) tablet 650 mg, 650 mg, Oral, Q6H PRN, Gillis Santa, MD, 650 mg at 06/13/20 0819 .  alum & mag hydroxide-simeth (MAALOX/MYLANTA) 200-200-20 MG/5ML suspension 15 mL, 15 mL, Oral, Q6H PRN, Agbata, Tochukwu, MD .  cyclobenzaprine (FLEXERIL) tablet 10 mg, 10 mg, Oral, QHS PRN, Agbata, Tochukwu, MD, 10 mg at 06/12/20 2016 .  enoxaparin (LOVENOX) injection 40 mg, 40 mg, Subcutaneous, Q24H, Agbata, Tochukwu, MD, 40 mg at 06/13/20 0819 .  levalbuterol (XOPENEX) nebulizer solution 1.25 mg, 1.25 mg, Nebulization, Q6H  PRN, Lucile Shutters, MD, 1.25 mg at 06/13/20 0815 .  magnesium oxide (MAG-OX) tablet 800 mg, 800 mg, Oral, Daily PRN, Agbata, Tochukwu, MD .  mometasone-formoterol (DULERA) 200-5 MCG/ACT inhaler 2 puff, 2 puff, Inhalation, BID, Agbata, Tochukwu, MD, 2 puff at 06/13/20 4401 .  montelukast (SINGULAIR) tablet 10 mg, 10 mg, Oral, QHS, Agbata, Tochukwu, MD, 10 mg at 06/12/20 2117 .  pantoprazole (PROTONIX) EC tablet 40 mg, 40 mg, Oral, Daily, Karna Christmas, Shamira Toutant, MD, 40 mg at 06/13/20 0820 .  [COMPLETED] methylPREDNISolone sodium succinate (SOLU-MEDROL) 40 mg/mL injection 40 mg, 40  mg, Intravenous, Q12H, 40 mg at 06/12/20 1952 **FOLLOWED BY** predniSONE (DELTASONE) tablet 40 mg, 40 mg, Oral, Q breakfast, Agbata, Tochukwu, MD, 40 mg at 06/13/20 0819 .  sodium chloride flush (NS) 0.9 % injection 3 mL, 3 mL, Intravenous, Q12H, Agbata, Tochukwu, MD, 3 mL at 06/13/20 0823 .  sodium chloride flush (NS) 0.9 % injection 3 mL, 3 mL, Intravenous, PRN, Agbata, Tochukwu, MD    ALLERGIES   Penicillins     REVIEW OF SYSTEMS    Review of Systems:  Gen:  Denies  fever, sweats, chills weigh loss  HEENT: Denies blurred vision, double vision, ear pain, eye pain, hearing loss, nose bleeds, sore throat Cardiac:  No dizziness, chest pain or heaviness, chest tightness,edema Resp:   Denies cough or sputum porduction, shortness of breath,wheezing, hemoptysis,  Gi: Denies swallowing difficulty, stomach pain, nausea or vomiting, diarrhea, constipation, bowel incontinence Gu:  Denies bladder incontinence, burning urine Ext:   Denies Joint pain, stiffness or swelling Skin: Denies  skin rash, easy bruising or bleeding or hives Endoc:  Denies polyuria, polydipsia , polyphagia or weight change Psych:   Denies depression, insomnia or hallucinations   Other:  All other systems negative   VS: BP (!) 142/80 (BP Location: Right Arm)   Pulse 89   Temp 97.8 F (36.6 C) (Oral)   Resp 16   Ht 5\' 3"  (1.6 m)   Wt 113.4 kg   SpO2 100%   BMI 44.29 kg/m      PHYSICAL EXAM    GENERAL:NAD, no fevers, chills, no weakness no fatigue HEAD: Normocephalic, atraumatic.  EYES: Pupils equal, round, reactive to light. Extraocular muscles intact. No scleral icterus.  MOUTH: Moist mucosal membrane. Dentition intact. No abscess noted.  EAR, NOSE, THROAT: Clear without exudates. No external lesions.  NECK: Supple. No thyromegaly. No nodules. No JVD.  PULMONARY: mild wheezing and crackles worse at bases posteriorly CARDIOVASCULAR: S1 and S2. Regular rate and rhythm. No murmurs, rubs, or gallops. No  edema. Pedal pulses 2+ bilaterally.  GASTROINTESTINAL: Soft, nontender, nondistended. No masses. Positive bowel sounds. No hepatosplenomegaly.  MUSCULOSKELETAL: No swelling, clubbing, or edema. Range of motion full in all extremities.  NEUROLOGIC: Cranial nerves II through XII are intact. No gross focal neurological deficits. Sensation intact. Reflexes intact.  SKIN: No ulceration, lesions, rashes, or cyanosis. Skin warm and dry. Turgor intact.  PSYCHIATRIC: Mood, affect within normal limits. The patient is awake, alert and oriented x 3. Insight, judgment intact.       IMAGING    DG Chest 2 View  Result Date: 06/11/2020 CLINICAL DATA:  Shortness of breath EXAM: CHEST - 2 VIEW COMPARISON:  04/04/2018 FINDINGS: Lingular/left base atelectasis or scarring. Right lung clear. Heart is normal size. No acute bony abnormality or effusions. IMPRESSION: Left base atelectasis or scarring. Electronically Signed   By: Charlett Nose M.D.   On: 06/11/2020 22:22   CT Angio Chest PE W/Cm &/  Or Wo Cm  Result Date: 06/12/2020 CLINICAL DATA:  Shortness of breath for 6 weeks, PE suspected, EXAM: CT ANGIOGRAPHY CHEST WITH CONTRAST TECHNIQUE: Multidetector CT imaging of the chest was performed using the standard protocol during bolus administration of intravenous contrast. Multiplanar CT image reconstructions and MIPs were obtained to evaluate the vascular anatomy. CONTRAST:  OMNIPAQUE IOHEXOL 350 MG/ML SOLN COMPARISON:  Radiograph 04/04/2018, 06/11/2020 FINDINGS: Cardiovascular: While there is satisfactory opacification of the pulmonary arteries, examination is significantly limited by extensive respiratory motion artifact. No large central filling defects are identified. Some hypoattenuation in the segmental branches of the right lower lobe are favored to be motion artifact related particularly given the negative D-dimer. Central pulmonary arteries are normal caliber. Normal heart size. No pericardial effusion.  Coronary artery calcifications are seen. Few calcifications on the aortic leaflets. The aortic root is suboptimally assessed given cardiac pulsation artifact. Atherosclerotic plaque within the normal caliber aorta. No acute luminal abnormality of the imaged aorta. No periaortic stranding or hemorrhage. Normal 3 vessel branching of the aortic arch. Proximal great vessels are unremarkable. Mediastinum/Nodes: No mediastinal fluid or gas. Normal thyroid gland and thoracic inlet. No acute abnormality of the trachea or esophagus. No worrisome mediastinal, hilar or axillary adenopathy. Lungs/Pleura: Postsurgical changes are seen in the right lung base compatible with history of partial right lower lobectomy. Some diffuse mild dependent atelectatic changes, possibly accentuated by imaging during exhalation for the angiographic technique. Some additional bandlike areas of opacity in the lung bases may reflect further volume loss or scarring. No consolidation, features of edema, pneumothorax, or effusion. No suspicious pulmonary nodules or masses. Upper Abdomen: Prior cholecystectomy. No acute abnormalities present in the visualized portions of the upper abdomen. Musculoskeletal: Exaggerated thoracic kyphosis with multilevel discogenic and facet degenerative changes. Some mild remote anterior wedging of the T6, T7 vertebrae is grossly unchanged from comparison radiography. Associated chest wall deformity with exaggerated AP diameter. No acute or conspicuous osseous lesions. No worrisome chest wall masses or lesions. Review of the MIP images confirms the above findings. IMPRESSION: 1. While there is satisfactory opacification of the pulmonary arteries, examination is significantly limited by extensive respiratory motion artifact. No large central filling defects are identified. Some hypoattenuation about the segmental branches of the right lower lobe are favored to be motion artifact related particularly given the negative  D-dimer. 2. Basilar and dependent atelectatic changes. No other acute intrathoracic process. 3. Postsurgical changes in the right lung base compatible with history of partial right lower lobectomy. 4. Mild remote anterior wedging of the T6, T7 vertebrae is favored to be chronic/remote. Associated chest wall deformity with increased AP diameter of the chest. 5. Aortic Atherosclerosis (ICD10-I70.0). Electronically Signed   By: Kreg Shropshire M.D.   On: 06/12/2020 03:40   ECHOCARDIOGRAM COMPLETE  Result Date: 06/13/2020    ECHOCARDIOGRAM REPORT   Patient Name:   Lindsay Wiggins Date of Exam: 06/13/2020 Medical Rec #:  [id number]      Height:       63.0 in Accession #:    [id number]     Weight:       250.0 lb Date of Birth:  Mar 21, 1955       BSA:          2.126 m Patient Age:    64 years       BP:           142/80 mmHg Patient Gender: F  HR:           89 bpm. Exam Location:  [facility] Procedure: 2D Echo, Cardiac Doppler and Color Doppler Indications:     Mitral valve stenosis I34.2  History:         Patient has no prior history of Echocardiogram examinations. No                  cardiac history listed in chart.  Sonographer:     Cristela Blue RDCS (AE) Referring Phys:  [id number] Vida Rigger Diagnosing Phys: Harold Hedge MD  Sonographer Comments: Technically challenging study due to limited acoustic windows, suboptimal apical window and suboptimal parasternal window. IMPRESSIONS  1. Left ventricular ejection fraction, by estimation, is 55 to 60%. The left ventricle has normal function. The left ventricle has no regional wall motion abnormalities. There is mild left ventricular hypertrophy. Left ventricular diastolic parameters are consistent with Grade I diastolic dysfunction (impaired relaxation).  2. Right ventricular systolic function was not well visualized. The right ventricular size is not well visualized.  3. The mitral valve was not well visualized. Trivial mitral valve regurgitation.  4. The aortic valve was  not well visualized. Aortic valve regurgitation is not visualized. FINDINGS  Left Ventricle: Left ventricular ejection fraction, by estimation, is 55 to 60%. The left ventricle has normal function. The left ventricle has no regional wall motion abnormalities. The left ventricular internal cavity size was normal in size. There is  mild left ventricular hypertrophy. Left ventricular diastolic parameters are consistent with Grade I diastolic dysfunction (impaired relaxation). Right Ventricle: The right ventricular size is not well visualized. Right vetricular wall thickness was not well visualized. Right ventricular systolic function was not well visualized. Left Atrium: Left atrial size was normal in size. Right Atrium: Right atrial size was normal in size. Pericardium: There is no evidence of pericardial effusion. Mitral Valve: The mitral valve was not well visualized. Trivial mitral valve regurgitation. Tricuspid Valve: The tricuspid valve is not well visualized. Tricuspid valve regurgitation is trivial. Aortic Valve: The aortic valve was not well visualized. Aortic valve regurgitation is not visualized. Aortic valve mean gradient measures 4.0 mmHg. Aortic valve peak gradient measures 7.1 mmHg. Aortic valve area, by VTI measures 2.64 cm. Pulmonic Valve: The pulmonic valve was not assessed. Pulmonic valve regurgitation is not visualized. Aorta: The aortic root was not well visualized. IAS/Shunts: The interatrial septum was not well visualized.  LEFT VENTRICLE PLAX 2D LVIDd:         3.57 cm  Diastology LVIDs:         2.45 cm  LV e' medial:    7.07 cm/s LV PW:         1.05 cm  LV E/e' medial:  10.7 LV IVS:        0.84 cm  LV e' lateral:   8.05 cm/s LVOT diam:     2.00 cm  LV E/e' lateral: 9.4 LV SV:         61 LV SV Index:   29 LVOT Area:     3.14 cm  RIGHT VENTRICLE RV Basal diam:  3.42 cm RV S prime:     12.50 cm/s TAPSE (M-mode): 3.0 cm LEFT ATRIUM             Index       RIGHT ATRIUM           Index LA diam:         2.30 cm 1.08 cm/m  RA Area:  10.40 cm LA Vol (A2C):   30.7 ml 14.44 ml/m RA Volume:   23.20 ml  10.91 ml/m LA Vol (A4C):   20.4 ml 9.60 ml/m LA Biplane Vol: 26.4 ml 12.42 ml/m  AORTIC VALVE                   PULMONIC VALVE AV Area (Vmax):    2.01 cm    PV Vmax:        0.87 m/s AV Area (Vmean):   2.10 cm    PV Peak grad:   3.0 mmHg AV Area (VTI):     2.64 cm    RVOT Peak grad: 3 mmHg AV Vmax:           133.00 cm/s AV Vmean:          92.600 cm/s AV VTI:            0.230 m AV Peak Grad:      7.1 mmHg AV Mean Grad:      4.0 mmHg LVOT Vmax:         84.90 cm/s LVOT Vmean:        61.900 cm/s LVOT VTI:          0.193 m LVOT/AV VTI ratio: 0.84  AORTA Ao Root diam: 2.50 cm MITRAL VALVE                TRICUSPID VALVE MV Area (PHT): 5.16 cm     TR Peak grad:   17.6 mmHg MV Decel Time: 147 msec     TR Vmax:        210.00 cm/s MV E velocity: 75.80 cm/s MV A velocity: 114.00 cm/s  SHUNTS MV E/A ratio:  0.66         Systemic VTI:  0.19 m                             Systemic Diam: 2.00 cm Harold Hedge MD Electronically signed by Harold Hedge MD Signature Date/Time: 06/13/2020/10:14:25 AM    Final       ASSESSMENT/PLAN   Recurrent respiratory distress Likely due to acute asthma exacerbation -There is absence of fever or leukocytosis to suggest acute infection, and CT chest is not concerning regarding PE or acute Pneumonia.   -She seems improved slightly arleady post IV solumedrol dose  -She is speaking infull sentences during interview in no distress post steroids and nebulizer therapy -There are mild ground glass opacities which may be due to expiratory images vs restriction from obese body habitus, would perform workup for allergic bronchopulmonary aspergillosis and allergic bronchopulmonary mycosis as well as Churg Strauss/ EGPA and non COVID viral panel -RVP -workup for atypical pneumonia - mycoplasma, legionella etc -hypersensitivity pneumonitis panel -ANCA panel -ANA comprehensive -Procalcitonin  and MRSA PCR for antimicrobial guidance -Fungitell serum  -Aspergillus ab -Histoplasma urine ag - patient noted to have new small pulm nodule on previous imaging -she notes new onset orthopnea which may indicate onset of cardiac valvular disease and will need further evaluation via full 2D echo -She denies having uncontrolled GERD, takes PRN H2 blockers     Bibasilar atelectasis  - likely due to deconditioning, asthma , and morbid obesity  - will need to perform recruitment maneuvers and chest physiotherapy - initiate BID metaNEB therapy with albuterol  - PT/OT on outpatient     Thank you for allowing me to participate in the care of this patient.  This document was prepared using Dragon voice recognition software and may include unintentional dictation errors.     Vida Rigger, M.D.  Division of Pulmonary & Critical Care Medicine  [facility]

## 2020-06-13 NOTE — Plan of Care (Signed)

## 2020-06-15 LAB — HYPERSENSITIVITY PNEUMONITIS
A. Pullulans Abs: NEGATIVE
A.Fumigatus #1 Abs: NEGATIVE
Micropolyspora faeni, IgG: NEGATIVE
Pigeon Serum Abs: NEGATIVE
Thermoact. Saccharii: NEGATIVE
Thermoactinomyces vulgaris, IgG: NEGATIVE

## 2020-06-16 LAB — ASPERGILLUS ANTIGEN, BAL/SERUM: Aspergillus Ag, BAL/Serum: 0.06 Index (ref 0.00–0.49)

## 2020-06-16 LAB — FUNGITELL, SERUM: Fungitell Result: 114 pg/mL — ABNORMAL HIGH (ref <80)

## 2021-02-13 ENCOUNTER — Emergency Department: Payer: Medicare PPO

## 2021-02-13 ENCOUNTER — Emergency Department
Admission: EM | Admit: 2021-02-13 | Discharge: 2021-02-13 | Disposition: A | Discharge status: Home / Self Care | Payer: Medicare PPO | Attending: Emergency Medicine | Admitting: Emergency Medicine

## 2021-02-13 ENCOUNTER — Ambulatory Visit: Admit: 2021-02-13 | Payer: Medicare PPO

## 2021-02-13 ENCOUNTER — Encounter: Payer: Self-pay | Admitting: Emergency Medicine

## 2021-02-13 ENCOUNTER — Other Ambulatory Visit: Payer: Self-pay

## 2021-02-13 DIAGNOSIS — Z85118 Personal history of other malignant neoplasm of bronchus and lung: Secondary | ICD-10-CM | POA: Insufficient documentation

## 2021-02-13 DIAGNOSIS — J45909 Unspecified asthma, uncomplicated: Secondary | ICD-10-CM | POA: Insufficient documentation

## 2021-02-13 DIAGNOSIS — R110 Nausea: Secondary | ICD-10-CM | POA: Diagnosis present

## 2021-02-13 DIAGNOSIS — N200 Calculus of kidney: Secondary | ICD-10-CM

## 2021-02-13 DIAGNOSIS — Z79899 Other long term (current) drug therapy: Secondary | ICD-10-CM | POA: Diagnosis not present

## 2021-02-13 DIAGNOSIS — R109 Unspecified abdominal pain: Secondary | ICD-10-CM

## 2021-02-13 DIAGNOSIS — N202 Calculus of kidney with calculus of ureter: Secondary | ICD-10-CM | POA: Insufficient documentation

## 2021-02-13 LAB — COMPREHENSIVE METABOLIC PANEL
ALT: 24 U/L (ref 0–44)
AST: 28 U/L (ref 15–41)
Albumin: 3.9 g/dL (ref 3.5–5.0)
Alkaline Phosphatase: 121 U/L (ref 38–126)
Anion gap: 7 (ref 5–15)
BUN: 18 mg/dL (ref 8–23)
CO2: 28 mmol/L (ref 22–32)
Calcium: 9.6 mg/dL (ref 8.9–10.3)
Chloride: 107 mmol/L (ref 98–111)
Creatinine, Ser: 0.76 mg/dL (ref 0.44–1.00)
GFR, Estimated: >60 mL/min (ref >60)
Glucose, Bld: 92 mg/dL (ref 70–99)
Potassium: 4.6 mmol/L (ref 3.5–5.1)
Sodium: 142 mmol/L (ref 135–145)
Total Bilirubin: 0.7 mg/dL (ref 0.3–1.2)
Total Protein: 7.1 g/dL (ref 6.5–8.1)

## 2021-02-13 LAB — CBC
HCT: 47.1 % — ABNORMAL HIGH (ref 36.0–46.0)
Hemoglobin: 15.1 g/dL — ABNORMAL HIGH (ref 12.0–15.0)
MCH: 29.9 pg (ref 26.0–34.0)
MCHC: 32.1 g/dL (ref 30.0–36.0)
MCV: 93.3 fL (ref 80.0–100.0)
Platelets: 284 10*3/uL (ref 150–400)
RBC: 5.05 MIL/uL (ref 3.87–5.11)
RDW: 13.1 % (ref 11.5–15.5)
WBC: 10.8 10*3/uL — ABNORMAL HIGH (ref 4.0–10.5)
nRBC: 0 % (ref 0.0–0.2)

## 2021-02-13 LAB — URINALYSIS, COMPLETE (UACMP) WITH MICROSCOPIC
Bacteria, UA: NONE SEEN
Bilirubin Urine: NEGATIVE
Glucose, UA: NEGATIVE mg/dL
Hgb urine dipstick: NEGATIVE
Ketones, ur: 5 mg/dL — AB
Leukocytes,Ua: NEGATIVE
Nitrite: NEGATIVE
Protein, ur: 100 mg/dL — AB
Specific Gravity, Urine: 1.031 — ABNORMAL HIGH (ref 1.005–1.030)
pH: 5 (ref 5.0–8.0)

## 2021-02-13 MED ORDER — ONDANSETRON HCL 4 MG PO TABS: 4.0000 mg | ORAL_TABLET | Freq: Every day | ORAL | 0 refills | Status: DC | PRN

## 2021-02-13 MED ORDER — CEPHALEXIN 500 MG PO CAPS: 500.0000 mg | ORAL_CAPSULE | Freq: Three times a day (TID) | ORAL | 0 refills | Status: DC

## 2021-02-13 MED ORDER — OXYCODONE-ACETAMINOPHEN 5-325 MG PO TABS
1.0000 | ORAL_TABLET | Freq: Once | ORAL | Status: AC
  Administered 2021-02-13: 13:00:00 1 via ORAL
  Filled 2021-02-13: qty 1

## 2021-02-13 MED ORDER — KETOROLAC TROMETHAMINE 60 MG/2ML IM SOLN
60.0000 mg | Freq: Once | INTRAMUSCULAR | Status: AC
  Administered 2021-02-13: 15:00:00 60 mg via INTRAMUSCULAR
  Filled 2021-02-13: qty 2

## 2021-02-13 MED ORDER — KETOROLAC TROMETHAMINE 10 MG PO TABS: 10.0000 mg | ORAL_TABLET | Freq: Four times a day (QID) | ORAL | 0 refills | Status: DC | PRN

## 2021-02-13 NOTE — ED Notes (Signed)
Patient transported to CT 

## 2021-02-13 NOTE — ED Provider Notes (Signed)
[facility] Emergency Department Provider Note  Time seen: 12:37 PM  I have reviewed the triage vital signs and the nursing notes.   HISTORY  Chief Complaint Flank Pain and Nausea  HPI Lindsay Wiggins is a 65 y.o. female with a past medical history of asthma, lung cancer status postresection, presents to the emergency department for left flank pain.  According to the patient over the past 2 days she has been experiencing intermittent abdominal discomfort with belching and flatus.  Denies any diarrhea.  Denies any vomiting.  Patient states today she developed sharp stabbing pain in the left flank.  No history of kidney stones.  No history of diverticulitis or colitis.  No fever cough or congestion.   Past Medical History:  Diagnosis Date   Asthma    Cancer ([place]) 2010   lung ca- most of rt lung resected    Patient Active Problem List   Diagnosis Date Noted   Asthma 06/13/2020   Asthma exacerbation 06/12/2020   Obesity, Class III, BMI 40-49.9 (morbid obesity) ([place]) 06/12/2020   Elevated blood pressure reading without diagnosis of hypertension 06/12/2020    Past Surgical History:  Procedure Laterality Date   ABDOMINAL HYSTERECTOMY     LUNG LOBECTOMY Right     Prior to Admission medications   Medication Sig Start Date End Date Taking? Authorizing Provider  albuterol (VENTOLIN HFA) 108 (90 Base) MCG/ACT inhaler Inhale 2 puffs into the lungs every 4 (four) hours as needed for wheezing or shortness of breath.    [provider]  cyclobenzaprine (FLEXERIL) 10 MG tablet Take 10 mg by mouth at bedtime as needed for muscle spasms.    [provider]  famotidine (PEPCID) 20 MG tablet Take 1 tablet (20 mg total) by mouth 2 (two) times daily for 7 days. 06/13/20 06/20/20  Val Riles, MD  Fluticasone-Salmeterol (ADVAIR) 500-50 MCG/DOSE AEPB Inhale 1 puff into the lungs 2 (two) times daily.    [provider]  levalbuterol Penne Lash) 1.25  MG/3ML nebulizer solution Take 1.25 mg by nebulization every 6 (six) hours as needed for shortness of breath or wheezing. 06/02/20   [provider]  magnesium oxide (MAG-OX) 400 MG tablet Take 800 mg by mouth daily as needed (leg cramps).    [provider]  montelukast (SINGULAIR) 10 MG tablet Take 10 mg by mouth at bedtime.    [provider]  Spacer/Aero-Holding Chambers (AEROCHAMBER MV) inhaler Use as instructed 05/02/20   Margarette Canada, NP    Allergies  Allergen Reactions   Penicillins Shortness Of Breath    Family History  Problem Relation Age of Onset   Breast cancer Neg Hx     Social History Social History   Tobacco Use   Smoking status: Never   Smokeless tobacco: Never  Vaping Use   Vaping Use: Never used  Substance Use Topics   Alcohol use: Yes    Review of Systems Constitutional: Negative for fever. Cardiovascular: Negative for chest pain. Respiratory: Negative for shortness of breath. Gastrointestinal: Left flank pain. Musculoskeletal: Negative for musculoskeletal complaints Neurological: Negative for headache All other ROS negative  ____________________________________________   PHYSICAL EXAM:  VITAL SIGNS: ED Triage Vitals  Enc Vitals Group     BP 02/13/21 1126 (!) 149/96     Pulse Rate 02/13/21 1126 97     Resp 02/13/21 1126 20     Temp 02/13/21 1126 98.5 F (36.9 C)     Temp Source 02/13/21 1126 Oral  SpO2 02/13/21 1126 100 %     Weight 02/13/21 1119 250 lb (113.4 kg)     Height 02/13/21 1119 5\' 3"  (1.6 m)     Head Circumference --      Peak Flow --      Pain Score 02/13/21 1119 8     Pain Loc --      Pain Edu? --      Excl. in [place]? --    Constitutional: Alert and oriented. Well appearing and in no distress. Eyes: Normal exam ENT      Head: Normocephalic and atraumatic.      Mouth/Throat: Mucous membranes are moist. Cardiovascular: Normal rate, regular rhythm.  Respiratory: Normal respiratory effort without  tachypnea nor retractions. Breath sounds are clear  Gastrointestinal: Soft and nontender. No distention.  Musculoskeletal: Nontender with normal range of motion in all extremities. Neurologic:  Normal speech and language. No gross focal neurologic deficits Skin:  Skin is warm, dry and intact.  Psychiatric: Mood and affect are normal. ____________________________________________   RADIOLOGY  CT shows likely 1 to 2 mm left distal ureteral stone.  ____________________________________________   INITIAL IMPRESSION / ASSESSMENT AND PLAN / ED COURSE  Pertinent labs & imaging results that were available during my care of the patient were reviewed by me and considered in my medical decision making (see chart for details).   Patient presents emergency department for abdominal discomfort and left flank pain.  According to the patient over the past 2 days she has been appearance and belching intermittent flatus, abdominal cramping today she developed 7/10 sharp pain to her left flank.  Denies any dysuria fever cough or congestion.  No vomiting or diarrhea.  Overall benign abdominal exam.  Differential is quite broad but would include ureterolithiasis, UTI/pyelonephritis, colitis or diverticulitis, enteritis.  Lab work is largely [place].  CT is pending, urine is pending.  CT shows a likely 1 to 2 mm left distal ureteral stone.  This fits the patient's clinical presentation.  We will dose Toradol in the emergency department and discharged with oral Toradol, Zofran.  Patient was unable to provide a urine sample large enough for urinalysis.  Urine culture has been sent.  We will cover with antibiotics as a precaution as the patient is unable to provide additional urine.  Patient will follow up with her doctor.  I discussed my typical kidney stone return precautions.  Lindsay Wiggins was evaluated in Emergency Department on 02/13/2021 for the symptoms described in the history of present illness. She was  evaluated in the context of the global COVID-19 pandemic, which necessitated consideration that the patient might be at risk for infection with the SARS-CoV-2 virus that causes COVID-19. Institutional protocols and algorithms that pertain to the evaluation of patients at risk for COVID-19 are in a state of rapid change based on information released by regulatory bodies including the CDC and federal and state organizations. These policies and algorithms were followed during the patient's care in the ED.  ____________________________________________   FINAL CLINICAL IMPRESSION(S) / ED DIAGNOSES  Left flank pain Kidney stone   Harvest Dark, MD 02/13/21 1450

## 2021-02-13 NOTE — ED Triage Notes (Signed)
Pt in with co nausea since Saturday, denies any vomiting or diarrhea. Pt also co left lower back pain with radiation to LLQ at times.

## 2021-02-13 NOTE — ED Notes (Signed)
Patient discharged to home per MD order. Patient in stable condition, and deemed medically cleared by ED provider for discharge. Discharge instructions reviewed with patient/family using "Teach Back"; verbalized understanding of medication education and administration, and information about follow-up care. Denies further concerns. ° °

## 2021-02-13 NOTE — ED Notes (Signed)
Lab called. Urine amount was not enough for UA but is enough for culture. Culture will be sent and urinalysis reordered. Pt apparently missed UA container while urinating.

## 2021-02-13 NOTE — ED Triage Notes (Signed)
Presents via EMS   presents with left flank pain which radiates into left side of abd    describes as sharp  sudden onset

## 2021-02-13 NOTE — ED Notes (Signed)
Ambulatory to bathroom.

## 2021-02-13 NOTE — ED Provider Notes (Signed)
Emergency Medicine Provider Triage Evaluation Note  Lindsay Wiggins , a 65 y.o. female  was evaluated in triage.  Pt complains of left-sided flank pain, sudden onset around 830 this morning, states feels like a labor pain and it radiates around to the abdomen.  Review of Systems  Positive: Flank pain, radiates to the abdomen,  Negative: Dysuria, vomiting, diarrhea  Physical Exam  BP (!) 149/96 (BP Location: Left Arm)    Pulse 97    Temp 98.5 F (36.9 C) (Oral)    Resp 20    Ht 5\' 3"  (1.6 m)    Wt 113.4 kg    SpO2 100%    BMI 44.29 kg/m  Gen:   Awake, no distress   Resp:  Normal effort  MSK:   Moves extremities without difficulty  Other:  Tender along the left flank  Medical Decision Making  Medically screening exam initiated at 11:33 AM.  Appropriate orders placed.  Toyoko Shrum was informed that the remainder of the evaluation will be completed by another provider, this initial triage assessment does not replace that evaluation, and the importance of remaining in the ED until their evaluation is complete.  Doing work-up for kidney stone due to sudden onset and flank pain.  Pain medication ordered for the patient   Weldon Inches 02/13/21 1134    Rada Hay, MD 02/13/21 1214

## 2021-02-14 LAB — URINE CULTURE: Culture: <10000 — AB

## 2021-03-18 ENCOUNTER — Ambulatory Visit
Admission: EM | Admit: 2021-03-18 | Discharge: 2021-03-18 | Disposition: A | Discharge status: Home / Self Care | Payer: Medicare PPO | Attending: Emergency Medicine | Admitting: Emergency Medicine

## 2021-03-18 ENCOUNTER — Other Ambulatory Visit: Payer: Self-pay

## 2021-03-18 DIAGNOSIS — B356 Tinea cruris: Secondary | ICD-10-CM | POA: Diagnosis not present

## 2021-03-18 MED ORDER — FLUCONAZOLE 200 MG PO TABS: 200.0000 mg | ORAL_TABLET | Freq: Every day | ORAL | 0 refills | Status: AC

## 2021-03-18 NOTE — ED Triage Notes (Signed)
Pt c/o rash along her groin, inner thigh, and under the front of her stomach x1week. Pt states that she went to the doctor and was given terbifinafine 1% cream. Pt states that it has gotten better in spots but it has moved further away from the original area. Pt states that not wearing clothes offers relief.   Pt was on Keflex for a kidney stone and when she stopped taking the Keflex the rash developed.

## 2021-03-18 NOTE — ED Provider Notes (Signed)
[facility]    CSN: [id number] Arrival date & time: 03/18/21  0809      History   Chief Complaint Chief Complaint  Patient presents with   Rash    HPI Kaziah Krizek is a 66 y.o. female.   Patient presents with erythematous rash present underneath the abdominal fold for 1 week.  Associated burning sensation, warmth, chills and subjective fever.  Was seen by PCP 5 days ago who prescribed terbanfine which has not been helpful.  Endorses that rash has worsened since and has begun to experience a burning sensation in her labia which she has begun Monistat for.  Has attempted use of powder antifungal which cause unbearable pain.  Endorses that rash began shortly after initiation of a keto diet.  History of asthma, cancer, obesity.     Past Medical History:  Diagnosis Date   Asthma    Cancer ([place]) 2010   lung ca- most of rt lung resected    Patient Active Problem List   Diagnosis Date Noted   Asthma 06/13/2020   Asthma exacerbation 06/12/2020   Obesity, Class III, BMI 40-49.9 (morbid obesity) ([place]) 06/12/2020   Elevated blood pressure reading without diagnosis of hypertension 06/12/2020    Past Surgical History:  Procedure Laterality Date   ABDOMINAL HYSTERECTOMY     LUNG LOBECTOMY Right     OB History   No obstetric history on file.      Home Medications    Prior to Admission medications   Medication Sig Start Date End Date Taking? Authorizing Provider  albuterol (VENTOLIN HFA) 108 (90 Base) MCG/ACT inhaler Inhale 2 puffs into the lungs every 4 (four) hours as needed for wheezing or shortness of breath.    [provider]  cephALEXin (KEFLEX) 500 MG capsule Take 1 capsule (500 mg total) by mouth 3 (three) times daily. 02/13/21   Harvest Dark, MD  cyclobenzaprine (FLEXERIL) 10 MG tablet Take 10 mg by mouth at bedtime as needed for muscle spasms.    [provider]  famotidine (PEPCID) 20 MG tablet Take 1 tablet (20 mg total) by  mouth 2 (two) times daily for 7 days. 06/13/20 06/20/20  Val Riles, MD  Fluticasone-Salmeterol (ADVAIR) 500-50 MCG/DOSE AEPB Inhale 1 puff into the lungs 2 (two) times daily.    [provider]  ketorolac (TORADOL) 10 MG tablet Take 1 tablet (10 mg total) by mouth every 6 (six) hours as needed. 02/13/21   Harvest Dark, MD  levalbuterol Penne Lash) 1.25 MG/3ML nebulizer solution Take 1.25 mg by nebulization every 6 (six) hours as needed for shortness of breath or wheezing. 06/02/20   [provider]  magnesium oxide (MAG-OX) 400 MG tablet Take 800 mg by mouth daily as needed (leg cramps).    [provider]  montelukast (SINGULAIR) 10 MG tablet Take 10 mg by mouth at bedtime.    [provider]  ondansetron (ZOFRAN) 4 MG tablet Take 1 tablet (4 mg total) by mouth daily as needed for nausea or vomiting. 02/13/21   Harvest Dark, MD  Spacer/Aero-Holding Chambers (AEROCHAMBER MV) inhaler Use as instructed 05/02/20   Margarette Canada, NP    Family History Family History  Problem Relation Age of Onset   Breast cancer Neg Hx     Social History Social History   Tobacco Use   Smoking status: Never   Smokeless tobacco: Never  Vaping Use   Vaping Use: Never used  Substance Use Topics   Alcohol use: Not Currently  Drug use: Never     Allergies   Penicillins   Review of Systems Review of Systems  Constitutional: Negative.   Respiratory: Negative.    Cardiovascular: Negative.   Skin:  Positive for rash. Negative for color change, pallor and wound.  Neurological: Negative.     Physical Exam Triage Vital Signs ED Triage Vitals  Enc Vitals Group     BP 03/18/21 0827 (!) 146/88     Pulse Rate 03/18/21 0827 94     Resp 03/18/21 0827 18     Temp 03/18/21 0827 98.4 F (36.9 C)     Temp Source 03/18/21 0827 Oral     SpO2 03/18/21 0827 96 %     Weight 03/18/21 0824 250 lb (113.4 kg)     Height 03/18/21 0824 5\' 4"  (1.626 m)     Head  Circumference --      Peak Flow --      Pain Score 03/18/21 0824 6     Pain Loc --      Pain Edu? --      Excl. in [place]? --    No data found.  Updated Vital Signs BP (!) 146/88 (BP Location: Left Arm)    Pulse 94    Temp 98.4 F (36.9 C) (Oral)    Resp 18    Ht 5\' 4"  (1.626 m)    Wt 250 lb (113.4 kg)    SpO2 96%    BMI 42.91 kg/m   Visual Acuity Right Eye Distance:   Left Eye Distance:   Bilateral Distance:    Right Eye Near:   Left Eye Near:    Bilateral Near:     Physical Exam Constitutional:      Appearance: Normal appearance.  HENT:     Head: Normocephalic.  Eyes:     Extraocular Movements: Extraocular movements intact.  Pulmonary:     Effort: Pulmonary effort is normal.  Skin:    Comments: Erythematous maculopapular rash spread throughout abdominal fold extending into the bilateral groin  Neurological:     Mental Status: She is alert and oriented to person, place, and time. Mental status is at baseline.  Psychiatric:        Mood and Affect: Mood normal.        Behavior: Behavior normal.     UC Treatments / Results  Labs (all labs ordered are listed, but only abnormal results are displayed) Labs Reviewed - No data to display  EKG   Radiology No results found.  Procedures Procedures (including critical care time)  Medications Ordered in UC Medications - No data to display  Initial Impression / Assessment and Plan / UC Course  I have reviewed the triage vital signs and the nursing notes.  Pertinent labs & imaging results that were available during my care of the patient were reviewed by me and considered in my medical decision making (see chart for details).  Tinea cruris  Skin is moistened on exam in abdominal fold, discussed with patient the importance of keeping area dry to promote healing, unable to tolerate medicated powder, may attempt use of cornstarch, also advised moisture absorbent cloths in abdominal fold, prescribed Diflucan 200 mg for 7  days, instructed to continue using terbinafine twice daily and patient instructed to discontinue keto diet as rash started shortly after, may follow-up with urgent care or primary care doctor for further evaluation if symptoms continue to persist   Final Clinical Impressions(s) / UC Diagnoses   Final diagnoses:  None   Discharge Instructions   None    ED Prescriptions   None    PDMP not reviewed this encounter.   Hans Eden, [place] 03/18/21 [phone]

## 2021-03-18 NOTE — Discharge Instructions (Signed)
Continue to refine cream twice a day, every morning and every evening, apply cream then cornstarch and let sit for at least an hour then you may remove product and apply cornstarch to keep area dry  You may use moisture absorbent cloths typically used for working out in your abdominal fold to further help keep the area dry  Take 1 Diflucan every morning for the next 7 days  Please stop the keto diet as this may be contributing to your symptoms  You may follow-up with urgent care or with your primary care doctor for reevaluation as needed

## 2021-08-03 IMAGING — CR DG CHEST 2V
1 series · 2 of 2 positions shown · non-contrast
Comparison: 04/04/2018

CLINICAL DATA: Shortness of breath

EXAM:
CHEST - 2 VIEW

[Series 1: dg chest 2 view · 0.14mm/px · 2 of 2 slices shown]
[im 1/2]
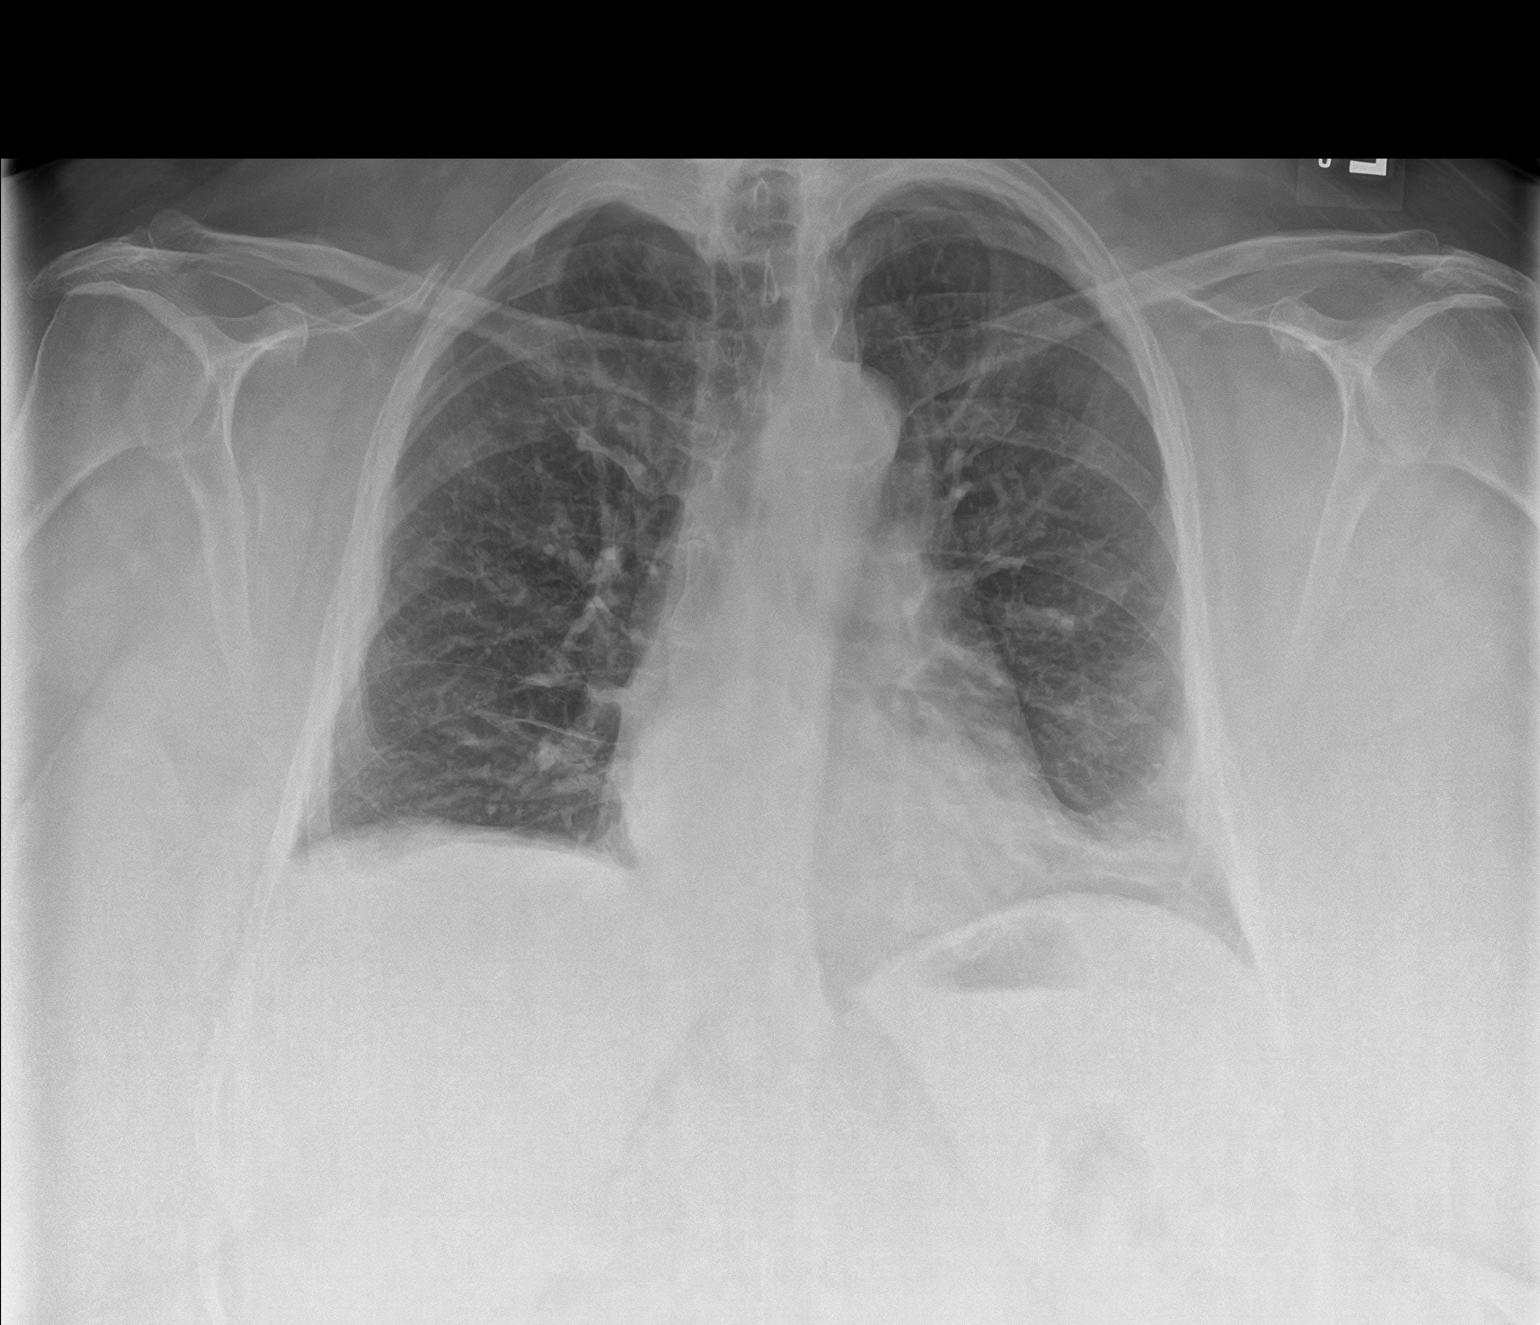
[im 2/2]
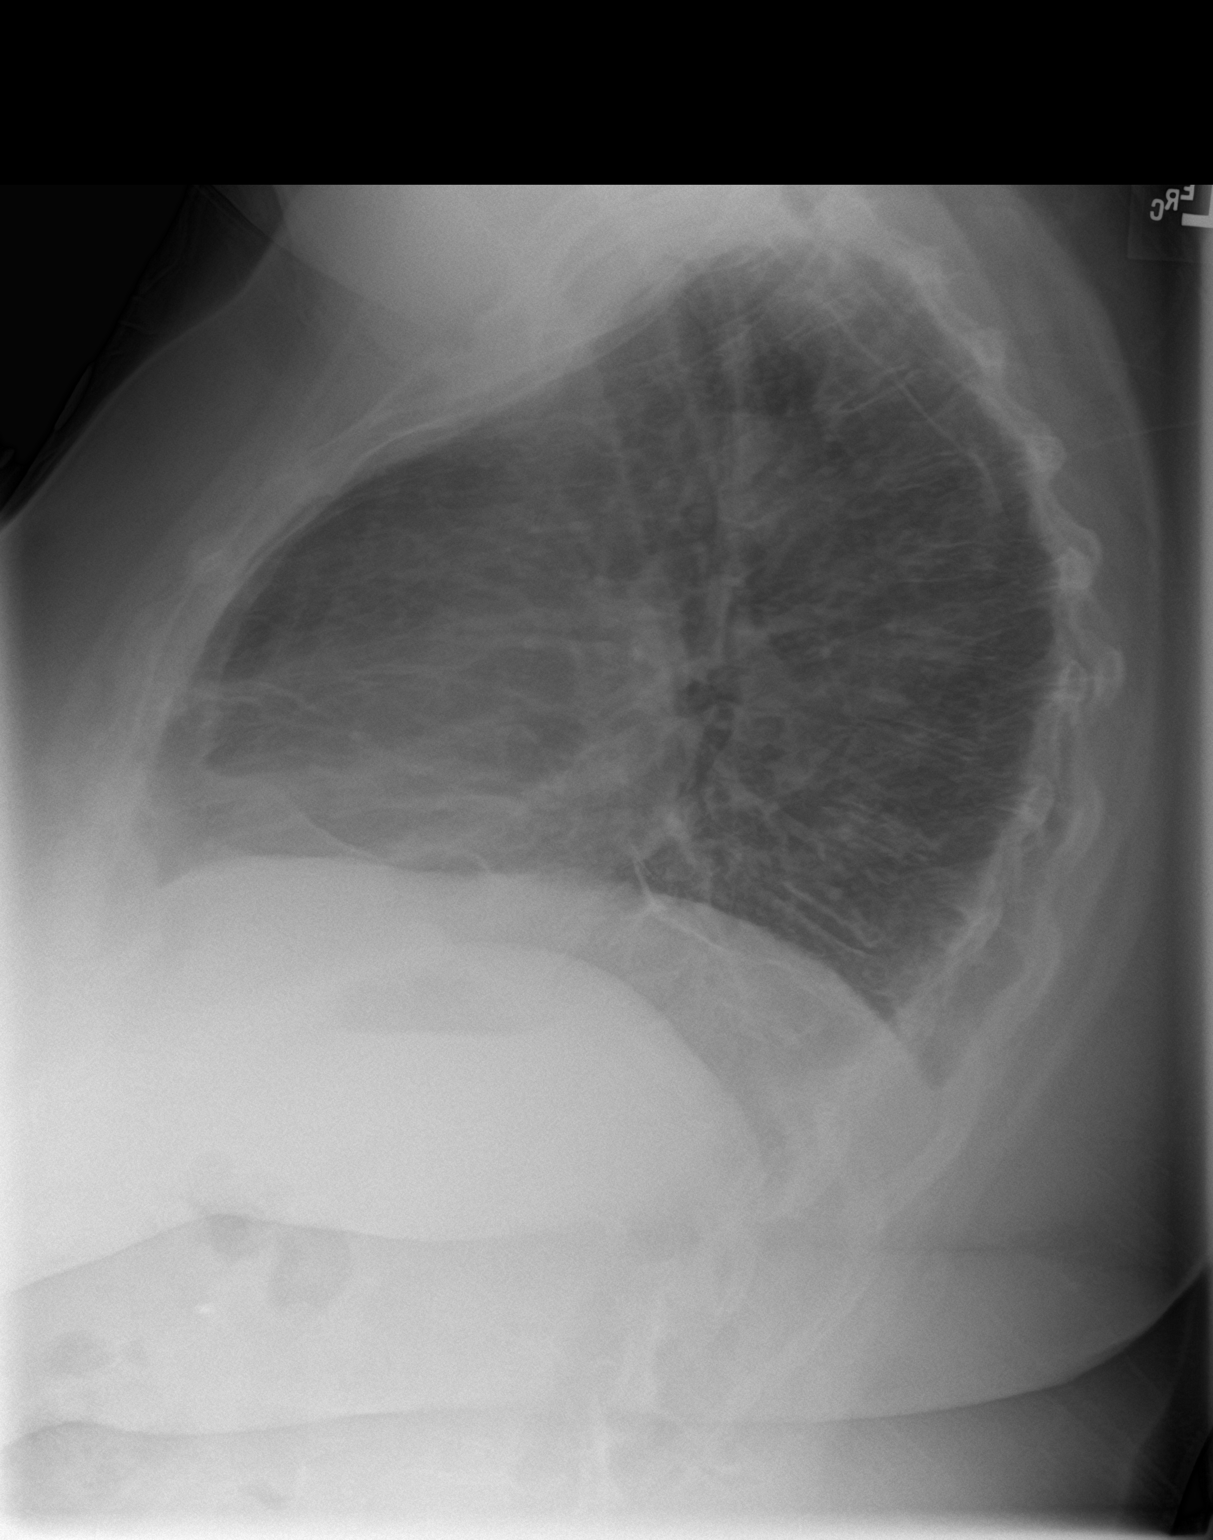

[2 of 2 positions shown; findings below may reference images not displayed]

FINDINGS: Lingular/left base atelectasis or scarring. Right lung clear. Heart
is normal size. No acute bony abnormality or effusions.
IMPRESSION: Left base atelectasis or scarring.

## 2022-03-14 ENCOUNTER — Other Ambulatory Visit: Payer: Self-pay | Admitting: Family Medicine

## 2022-03-14 DIAGNOSIS — Z1231 Encounter for screening mammogram for malignant neoplasm of breast: Secondary | ICD-10-CM

## 2022-03-21 ENCOUNTER — Ambulatory Visit
Admission: RE | Admit: 2022-03-21 | Discharge: 2022-03-21 | Disposition: A | Discharge status: Home / Self Care | Payer: Medicare PPO | Source: Ambulatory Visit | Attending: Family Medicine | Admitting: Family Medicine

## 2022-03-21 DIAGNOSIS — Z1231 Encounter for screening mammogram for malignant neoplasm of breast: Secondary | ICD-10-CM | POA: Insufficient documentation

## 2022-04-07 IMAGING — CT CT RENAL STONE PROTOCOL
2 of 4 series · 16 of 46 positions shown, 18 images · non-contrast
Comparison: None.

CLINICAL DATA: Flank pain.  Evaluate for kidney stone.

EXAM:
CT ABDOMEN AND PELVIS WITHOUT CONTRAST
TECHNIQUE: Multidetector CT imaging of the abdomen and pelvis was performed
following the standard protocol without IV contrast.

[Series 2: stone full standard · axial · 0.89mm/px · z∈[-620,-146]mm · 13 of 105 slices shown, 15 images]
[im 5/105  soft-tissue]
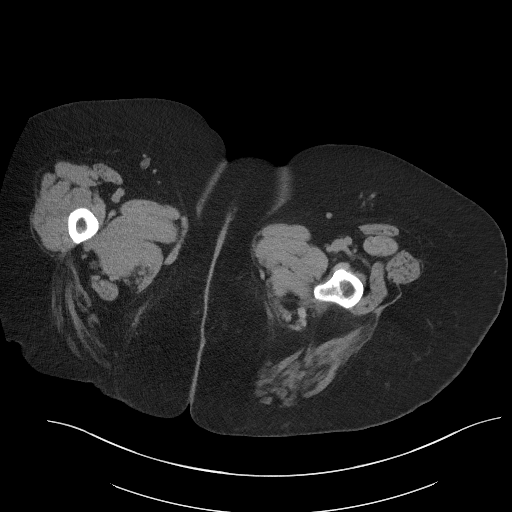
[im 5/105  bone]
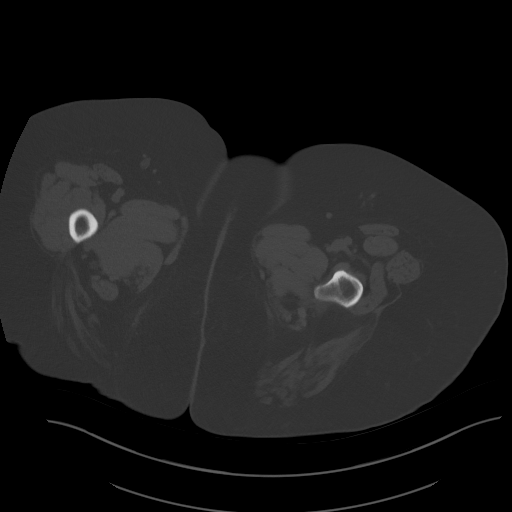
[im 14/105  soft-tissue]
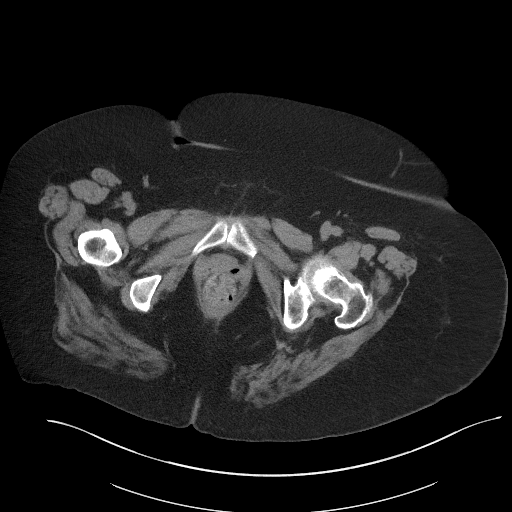
[im 23/105  soft-tissue]
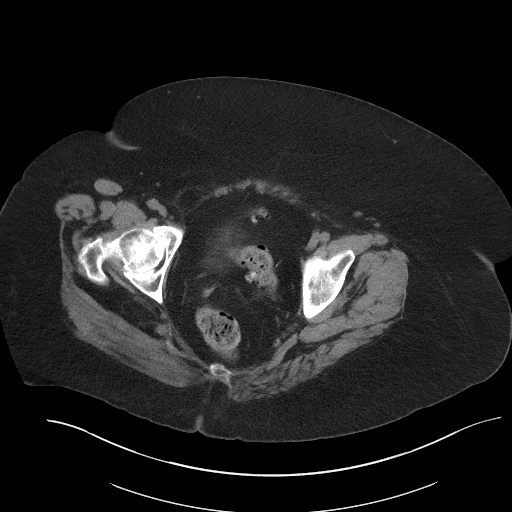
[im 28/105  soft-tissue]
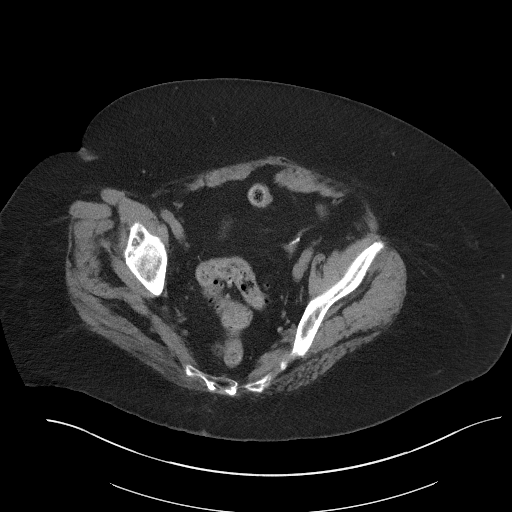
[im 37/105  soft-tissue]
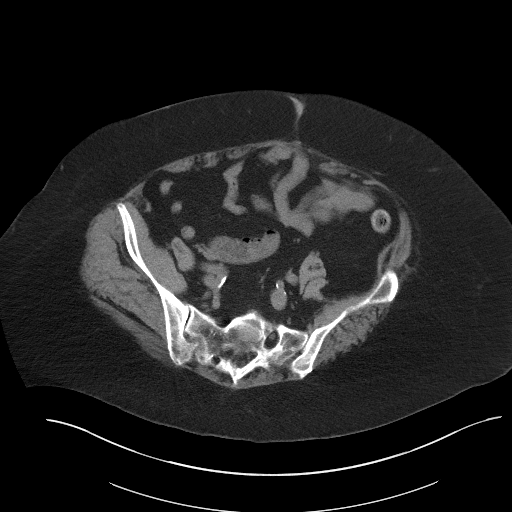
[im 46/105  soft-tissue]
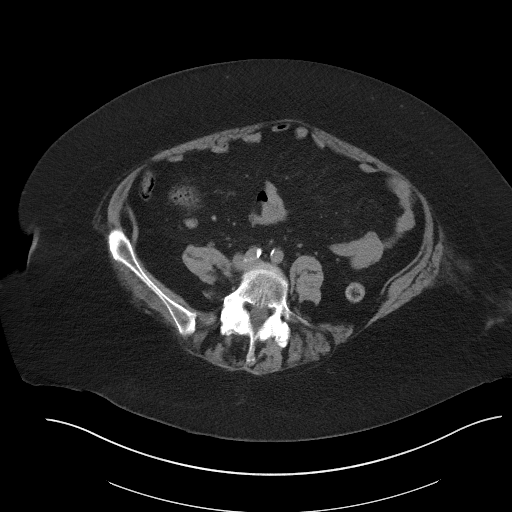
[im 55/105  soft-tissue]
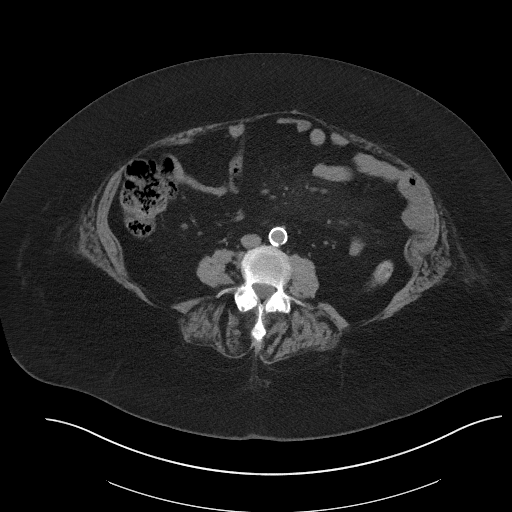
[im 59/105  soft-tissue]
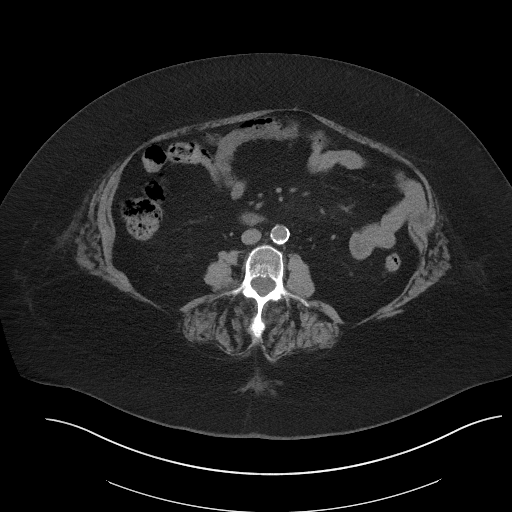
[im 68/105  soft-tissue]
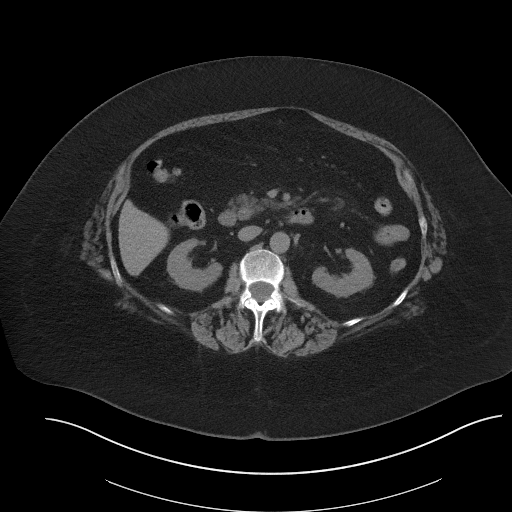
[im 68/105  bone]
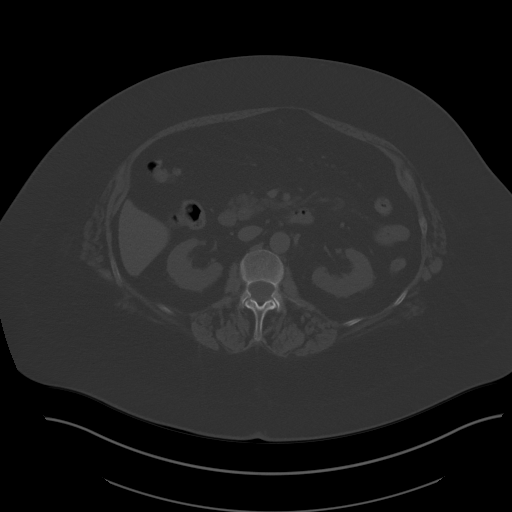
[im 77/105  soft-tissue]
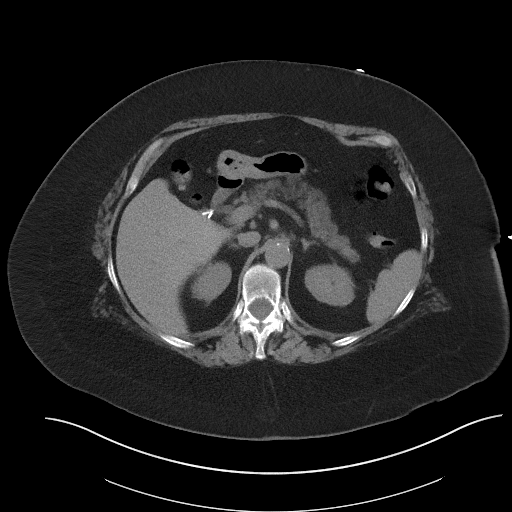
[im 82/105  soft-tissue]
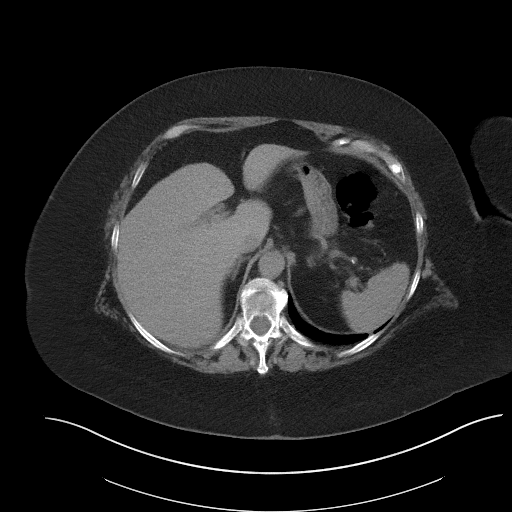
[im 91/105  soft-tissue]
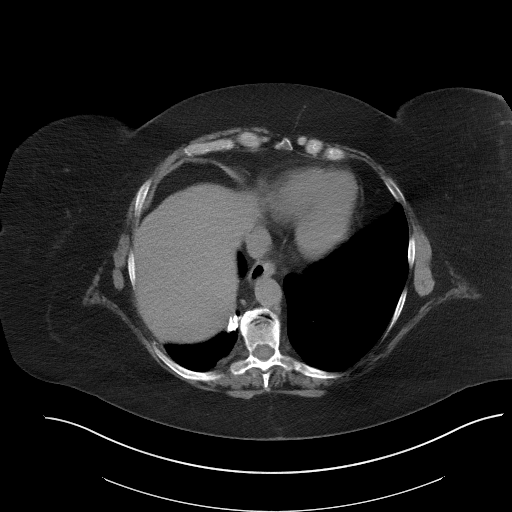
[im 100/105  soft-tissue]
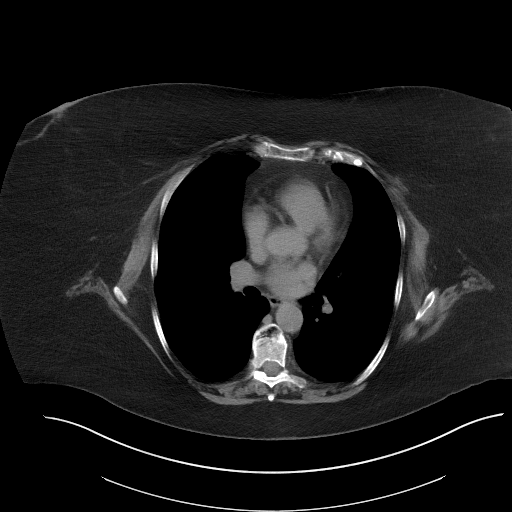

[Series 5: coronal · coronal · 0.98mm/px · 3 of 159 slices shown]
[im 53/159  soft-tissue]
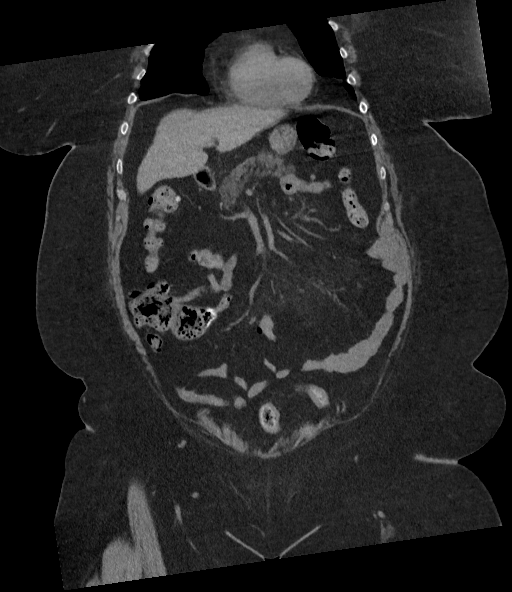
[im 71/159  soft-tissue]
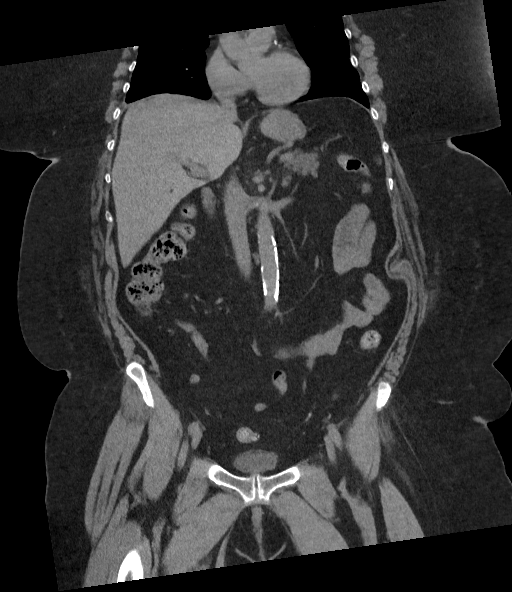
[im 88/159  soft-tissue]
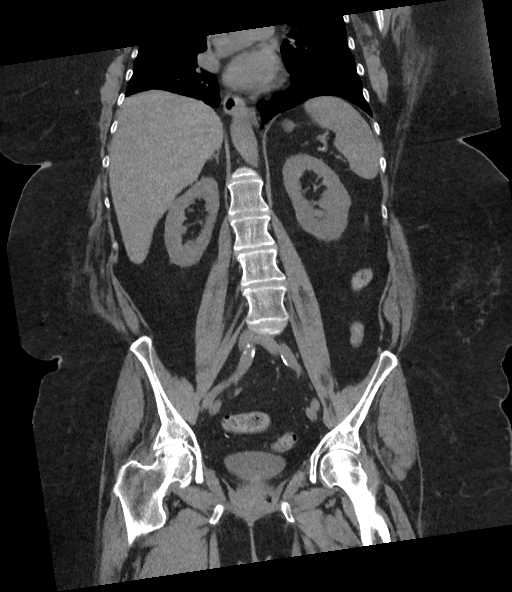

[16 of 46 positions shown; findings below may reference images not displayed]

FINDINGS: Lower chest: No acute abnormality. Perifissural nodule is identified
along the posterior aspect of the major fissure compatible with a
benign intrapulmonary lymph node.

Hepatobiliary: No focal liver abnormality is seen. Status post
cholecystectomy. No biliary dilatation.

Pancreas: Unremarkable. No pancreatic ductal dilatation or
surrounding inflammatory changes.

Spleen: Normal in size without focal abnormality.

Adrenals/Urinary Tract: Normal adrenal glands. No kidney mass,
nephrolithiasis, or hydronephrosis identified bilaterally. There is
a tiny, punctate calcification in the region of the distal left
ureter measuring 1-2 mm, image 82/2. The urinary bladder appears
within normal limits.

Stomach/Bowel: Small hiatal hernia. Stomach otherwise unremarkable.
The appendix is visualized and appears normal. Scattered colonic
diverticula noted without signs of acute diverticulitis.

Vascular/Lymphatic: Aortic atherosclerosis. No aneurysm. No
abdominopelvic adenopathy.

Reproductive: Status post hysterectomy. No adnexal masses.

Other: No free fluid or fluid collections.

Musculoskeletal: No acute or suspicious osseous findings.
IMPRESSION: 1. There is a tiny, punctate calcification in the region of the
distal left ureter measuring 1-2 mm. This may represent punctate,
nonobstructing ureteral calculus versus tiny calcified phlebolith
adjacent to the distal left ureter. There is no associated
hydroureter, periureteral fat stranding or hydronephrosis. No
alternative explanation for patient's left flank pain identified.
2. Small hiatal hernia.
3. Aortic Atherosclerosis ([id number]).

## 2022-07-09 ENCOUNTER — Ambulatory Visit: Admit: 2022-07-09 | Payer: Self-pay

## 2022-07-09 ENCOUNTER — Ambulatory Visit
Admission: EM | Admit: 2022-07-09 | Discharge: 2022-07-09 | Disposition: A | Discharge status: Home / Self Care | Payer: Medicare PPO | Attending: Family Medicine | Admitting: Family Medicine

## 2022-07-09 ENCOUNTER — Encounter: Payer: Self-pay | Admitting: Emergency Medicine

## 2022-07-09 DIAGNOSIS — H1032 Unspecified acute conjunctivitis, left eye: Secondary | ICD-10-CM

## 2022-07-09 MED ORDER — POLYMYXIN B-TRIMETHOPRIM 10000-0.1 UNIT/ML-% OP SOLN: 2.0000 [drp] | Freq: Four times a day (QID) | OPHTHALMIC | 0 refills | Status: DC

## 2022-07-09 NOTE — ED Provider Notes (Signed)
[facility]    CSN: [id number] Arrival date & time: 07/09/22  1141      History   Chief Complaint Chief Complaint  Patient presents with   Conjunctivitis    HPI HPI  Lindsay Wiggins is a 67 y.o. female.    Lindsay Wiggins presents for  eye pain that started after waking up this morning. Has been applying warm compresses with some relief. Has some greenish yellow discharge.   Lindsay Wiggins does not feel like something is in her eye. No one she was around yesterday has similar sx.   Lindsay Wiggins does wear glasses.  Lindsay Wiggins has not had any trouble seeing.  However, eye pain remains.  Lindsay Wiggins has otherwise been well and has no additional concerns today.     Past Medical History:  Diagnosis Date   Asthma    Cancer (HCC) 2010   lung ca- most of rt lung resected    Patient Active Problem List   Diagnosis Date Noted   Asthma 06/13/2020   Asthma exacerbation 06/12/2020   Obesity, Class III, BMI 40-49.9 (morbid obesity) (HCC) 06/12/2020   Elevated blood pressure reading without diagnosis of hypertension 06/12/2020    Past Surgical History:  Procedure Laterality Date   ABDOMINAL HYSTERECTOMY     LUNG LOBECTOMY Right     OB History   No obstetric history on file.      Home Medications    Prior to Admission medications   Medication Sig Start Date End Date Taking? Authorizing Provider  trimethoprim-polymyxin b (POLYTRIM) ophthalmic solution Place 2 drops into the left eye every 6 (six) hours. For 7 days 07/09/22  Yes Gerica Koble, DO  albuterol (VENTOLIN HFA) 108 (90 Base) MCG/ACT inhaler Inhale 2 puffs into the lungs every 4 (four) hours as needed for wheezing or shortness of breath.    [provider]  cyclobenzaprine (FLEXERIL) 10 MG tablet Take 10 mg by mouth at bedtime as needed for muscle spasms.    [provider]  famotidine (PEPCID) 20 MG tablet Take 1 tablet (20 mg total) by mouth 2 (two) times daily for 7 days. 06/13/20 06/20/20  Gillis Santa, MD   levalbuterol Pauline Aus) 1.25 MG/3ML nebulizer solution Take 1.25 mg by nebulization every 6 (six) hours as needed for shortness of breath or wheezing. 06/02/20   [provider]  meloxicam (MOBIC) 15 MG tablet Take 15 mg by mouth daily.    [provider]  montelukast (SINGULAIR) 10 MG tablet Take 10 mg by mouth at bedtime.    [provider]  Spacer/Aero-Holding Chambers (AEROCHAMBER MV) inhaler Use as instructed 05/02/20   Becky Augusta, NP    Family History Family History  Problem Relation Age of Onset   Breast cancer Neg Hx     Social History Social History   Tobacco Use   Smoking status: Never   Smokeless tobacco: Never  Vaping Use   Vaping Use: Never used  Substance Use Topics   Alcohol use: Not Currently   Drug use: Never     Allergies   Penicillins   Review of Systems Review of Systems : negative unless otherwise stated in HPI.      Physical Exam Triage Vital Signs ED Triage Vitals  Enc Vitals Group     BP 07/09/22 1306 128/82     Pulse Rate 07/09/22 1306 93     Resp 07/09/22 1306 16     Temp 07/09/22 1306 98.3 F (36.8 C)     Temp Source 07/09/22 1306  Oral     SpO2 07/09/22 1306 99 %     Weight --      Height --      Head Circumference --      Peak Flow --      Pain Score 07/09/22 1304 0     Pain Loc --      Pain Edu? --      Excl. in GC? --    No data found.  Updated Vital Signs BP 128/82 (BP Location: Right Arm)   Pulse 93   Temp 98.3 F (36.8 C) (Oral)   Resp 16   SpO2 99%   Visual Acuity Right Eye Distance:   Left Eye Distance:   Bilateral Distance:    Right Eye Near:   Left Eye Near:    Bilateral Near:     Physical Exam  GEN: pleasant well appearing female, in no acute distress  NECK: normal ROM  CV: regular rate  RESP: no increased work of breathing EYES:     General: Lids are normal.  no foreign bodies appreciated. Vision grossly intact. Gaze aligned appropriately.        Right eye: No discharge.   No discharge or hordeolum       Left eye: No foreign body hordeolum.     Extraocular Movements: Extraocular movements intact.     PERRLA     Conjunctiva/sclera:     Left eye: Left conjunctiva is injected. No chemosis or hemorrhage.  Mucoid discharge on the lashes and medial canthus.    SKIN: warm and dry   UC Treatments / Results  Labs (all labs ordered are listed, but only abnormal results are displayed) Labs Reviewed - No data to display  EKG   Radiology No results found.  Procedures Procedures (including critical care time)  Medications Ordered in UC Medications - No data to display  Initial Impression / Assessment and Plan / UC Course  I have reviewed the triage vital signs and the nursing notes.  Pertinent labs & imaging results that were available during my care of the patient were reviewed by me and considered in my medical decision making (see chart for details).     Patient is a 67 y.o. female who presents after left redness and discharge.  On exam, she has a evidence of conjunctivitis on the left. Treat with Polytrim eye drops.  Advised to follow-up with an ophthalmologist or optometrist, if  discomfort/pain is not improving after 7-day course. Understanding voiced.   Discussed MDM, treatment plan and plan for follow-up with patient who agrees with plan.  Final Clinical Impressions(s) / UC Diagnoses   Final diagnoses:  Acute bacterial conjunctivitis of left eye     Discharge Instructions      Stop by the pharmacy to pick up your prescriptions.  Follow up with your eye care provider as needed.       ED Prescriptions     Medication Sig Dispense Auth. Provider   trimethoprim-polymyxin b (POLYTRIM) ophthalmic solution Place 2 drops into the left eye every 6 (six) hours. For 7 days 10 mL Katha Cabal, DO      PDMP not reviewed this encounter.   Katha Cabal, DO 07/09/22 1350

## 2022-07-09 NOTE — ED Triage Notes (Signed)
Pt presents with left redness and irritation since this morning.

## 2022-07-09 NOTE — Discharge Instructions (Addendum)
Stop by the pharmacy to pick up your prescriptions.  Follow up with your eye care provider as needed.  

## 2023-06-23 ENCOUNTER — Encounter: Payer: Self-pay | Admitting: Emergency Medicine

## 2023-06-23 ENCOUNTER — Ambulatory Visit
Admission: EM | Admit: 2023-06-23 | Discharge: 2023-06-23 | Disposition: A | Discharge status: Home / Self Care | Attending: Emergency Medicine | Admitting: Emergency Medicine

## 2023-06-23 DIAGNOSIS — N390 Urinary tract infection, site not specified: Secondary | ICD-10-CM | POA: Insufficient documentation

## 2023-06-23 LAB — CBC WITH DIFFERENTIAL/PLATELET
Abs Immature Granulocytes: 0.05 10*3/uL (ref 0.00–0.07)
Basophils Absolute: 0.1 10*3/uL (ref 0.0–0.1)
Basophils Relative: 1 %
Eosinophils Absolute: 0.3 10*3/uL (ref 0.0–0.5)
Eosinophils Relative: 2 %
HCT: 46.7 % — ABNORMAL HIGH (ref 36.0–46.0)
Hemoglobin: 14.8 g/dL (ref 12.0–15.0)
Immature Granulocytes: 0 %
Lymphocytes Relative: 10 %
Lymphs Abs: 1.1 10*3/uL (ref 0.7–4.0)
MCH: 28.7 pg (ref 26.0–34.0)
MCHC: 31.7 g/dL (ref 30.0–36.0)
MCV: 90.7 fL (ref 80.0–100.0)
Monocytes Absolute: 0.7 10*3/uL (ref 0.1–1.0)
Monocytes Relative: 7 %
Neutro Abs: 9.2 10*3/uL — ABNORMAL HIGH (ref 1.7–7.7)
Neutrophils Relative %: 80 %
Platelets: 275 10*3/uL (ref 150–400)
RBC: 5.15 MIL/uL — ABNORMAL HIGH (ref 3.87–5.11)
RDW: 13.2 % (ref 11.5–15.5)
WBC Morphology: INCREASED
WBC: 11.4 10*3/uL — ABNORMAL HIGH (ref 4.0–10.5)
nRBC: 0 % (ref 0.0–0.2)

## 2023-06-23 LAB — COMPREHENSIVE METABOLIC PANEL WITH GFR
ALT: 23 U/L (ref 0–44)
AST: 27 U/L (ref 15–41)
Albumin: 4 g/dL (ref 3.5–5.0)
Alkaline Phosphatase: 114 U/L (ref 38–126)
Anion gap: 9 (ref 5–15)
BUN: 25 mg/dL — ABNORMAL HIGH (ref 8–23)
CO2: 25 mmol/L (ref 22–32)
Calcium: 9 mg/dL (ref 8.9–10.3)
Chloride: 100 mmol/L (ref 98–111)
Creatinine, Ser: 0.76 mg/dL (ref 0.44–1.00)
GFR, Estimated: >60 mL/min (ref >60)
Glucose, Bld: 104 mg/dL — ABNORMAL HIGH (ref 70–99)
Potassium: 4.5 mmol/L (ref 3.5–5.1)
Sodium: 134 mmol/L — ABNORMAL LOW (ref 135–145)
Total Bilirubin: 0.4 mg/dL (ref 0.0–1.2)
Total Protein: 7 g/dL (ref 6.5–8.1)

## 2023-06-23 LAB — URINALYSIS, W/ REFLEX TO CULTURE (INFECTION SUSPECTED)
Glucose, UA: 500 mg/dL — AB
Hgb urine dipstick: NEGATIVE
Ketones, ur: 40 mg/dL — AB
Nitrite: POSITIVE — AB
Protein, ur: >300 mg/dL — AB
RBC / HPF: NONE SEEN RBC/hpf (ref 0–5)
Specific Gravity, Urine: <1.005 — ABNORMAL LOW (ref 1.005–1.030)
pH: 8.5 — ABNORMAL HIGH (ref 5.0–8.0)

## 2023-06-23 MED ORDER — PHENAZOPYRIDINE HCL 200 MG PO TABS: 200.0000 mg | ORAL_TABLET | Freq: Three times a day (TID) | ORAL | 0 refills | Status: AC | PRN

## 2023-06-23 MED ORDER — IBUPROFEN 600 MG PO TABS: 600.0000 mg | ORAL_TABLET | Freq: Four times a day (QID) | ORAL | 0 refills | Status: AC | PRN

## 2023-06-23 MED ORDER — SULFAMETHOXAZOLE-TRIMETHOPRIM 800-160 MG PO TABS: 1.0000 | ORAL_TABLET | Freq: Two times a day (BID) | ORAL | 0 refills | Status: AC

## 2023-06-23 NOTE — Discharge Instructions (Addendum)
 Your blood work was unremarkable.  Mild leukocytosis, but liver enzymes, kidney function was normal.  We have sent your urine off for culture to make sure we have you on the right antibiotic.  We will contact you if we need to change your antibiotics.  Finish the Bactrim , even if you feel better.  Push electrolyte containing fluids such as Pedialyte, Gatorade, liquid IV until your urine is clear.  I am sending you home with Pyridium to help with pain relief.  You can also take ibuprofen combined with 1000 mg of Tylenol  3-4 times a day for pain.  Go to the ER if you get worse.  Follow-up with your primary care provider in 2 weeks to make sure your urine has normalized.

## 2023-06-23 NOTE — ED Provider Notes (Signed)
 HPI  SUBJECTIVE:  Lindsay Wiggins is a 68 y.o. female who presents with 3 days of stabbing vaginal pain at the end of urination that last 30 seconds to a  minute.  She reports urinary urgency, frequency, hematuria and urinating small amounts at a time.  No cloudy or odorous urine, vaginal bleeding, odor, discharge, fevers, abdominal, back, pelvic pain, vaginal mass.  No antibiotics in the past month.  No antipyretic in the past 6 hours.  She tried increasing fluids and Neurontin.  The Neurontin helps with her symptoms.  Symptoms when she urinates.  She has a past medical history of UTI, pyelonephritis, vaginal yeast infections, asthma, lung cancer status post right lung resection, status post cholecystectomy.  No history of diabetes, hypertension, nephrolithiasis, liver disease, BV.  [facility]: [facility]   Past Medical History:  Diagnosis Date   Asthma    Cancer (HCC) 2010   lung ca- most of rt lung resected    Past Surgical History:  Procedure Laterality Date   ABDOMINAL HYSTERECTOMY     LUNG LOBECTOMY Right     Family History  Problem Relation Age of Onset   Breast cancer Neg Hx     Social History   Tobacco Use   Smoking status: Never   Smokeless tobacco: Never  Vaping Use   Vaping status: Never Used  Substance Use Topics   Alcohol use: Not Currently   Drug use: Never    No current facility-administered medications for this encounter.  Current Outpatient Medications:    phenazopyridine (PYRIDIUM) 200 MG tablet, Take 1 tablet (200 mg total) by mouth 3 (three) times daily as needed for pain., Disp: 6 tablet, Rfl: 0   sulfamethoxazole -trimethoprim  (BACTRIM  DS) 800-160 MG tablet, Take 1 tablet by mouth 2 (two) times daily for 10 days., Disp: 20 tablet, Rfl: 0   TRELEGY ELLIPTA 200-62.5-25 MCG/ACT AEPB, Inhale 1 puff into the lungs daily., Disp: , Rfl:    albuterol  (VENTOLIN  HFA) 108 (90 Base) MCG/ACT inhaler, Inhale 2 puffs into the lungs every 4 (four) hours as  needed for wheezing or shortness of breath., Disp: , Rfl:    cyclobenzaprine  (FLEXERIL ) 10 MG tablet, Take 10 mg by mouth at bedtime as needed for muscle spasms., Disp: , Rfl:    famotidine  (PEPCID ) 20 MG tablet, Take 1 tablet (20 mg total) by mouth 2 (two) times daily for 7 days., Disp: 14 tablet, Rfl: 0   gabapentin (NEURONTIN) 300 MG capsule, Take by mouth., Disp: , Rfl:    levalbuterol  (XOPENEX ) 1.25 MG/3ML nebulizer solution, Take 1.25 mg by nebulization every 6 (six) hours as needed for shortness of breath or wheezing., Disp: , Rfl:    meloxicam (MOBIC) 15 MG tablet, Take 15 mg by mouth daily., Disp: , Rfl:    montelukast  (SINGULAIR ) 10 MG tablet, Take 10 mg by mouth at bedtime., Disp: , Rfl:    Spacer/Aero-Holding Chambers (AEROCHAMBER MV) inhaler, Use as instructed, Disp: 1 each, Rfl: 2   traZODone (DESYREL) 50 MG tablet, Take 50 mg by mouth at bedtime., Disp: , Rfl:   Allergies  Allergen Reactions   Penicillins Shortness Of Breath     ROS  As noted in HPI.   Physical Exam  BP 134/71 (BP Location: Left Arm)   Pulse (!) 109   Temp 98.9 F (37.2 C) (Oral)   Resp 16   Ht 5\' 4"  (1.626 m)   Wt 113.4 kg   SpO2 93%   BMI 42.91 kg/m   Constitutional:  Well developed, well nourished, no acute distress Eyes:  EOMI, conjunctiva normal bilaterally HENT: Normocephalic, atraumatic,mucus membranes moist Respiratory: Normal inspiratory effort Cardiovascular: Tachycardia GI: nondistended.  Positive suprapubic tenderness.  Left flank tenderness.  No other abdominal tenderness.  No hepatomegaly. Back: No CVAT  GU: Deferred skin: No rash, skin intact Musculoskeletal: no deformities Neurologic: Alert & oriented x 3, no focal neuro deficits Psychiatric: Speech and behavior appropriate   ED Course   Medications - No data to display  Orders Placed This Encounter  Procedures   Urine Culture    Standing Status:   Standing    Number of Occurrences:   1   Urinalysis, w/ Reflex to  Culture (Infection Suspected) -Urine, Clean Catch    Standing Status:   Standing    Number of Occurrences:   1    Specimen Source:   Urine, Clean Catch [76]   CBC with Differential    Standing Status:   Standing    Number of Occurrences:   1   Comprehensive metabolic panel    Standing Status:   Standing    Number of Occurrences:   1    Results for orders placed or performed during the hospital encounter of 06/23/23 (from the past 24 hours)  Urinalysis, w/ Reflex to Culture (Infection Suspected) -Urine, Clean Catch     Status: Abnormal   Collection Time: 06/23/23  1:55 PM  Result Value Ref Range   Specimen Source URINE, CLEAN CATCH    Color, Urine ORANGE (A) YELLOW   APPearance HAZY (A) CLEAR   Specific Gravity, Urine <1.005 (L) 1.005 - 1.030   pH 8.5 (H) 5.0 - 8.0   Glucose, UA 500 (A) NEGATIVE mg/dL   Hgb urine dipstick NEGATIVE NEGATIVE   Bilirubin Urine LARGE (A) NEGATIVE   Ketones, ur 40 (A) NEGATIVE mg/dL   Protein, ur >604 (A) NEGATIVE mg/dL   Nitrite POSITIVE (A) NEGATIVE   Leukocytes,Ua LARGE (A) NEGATIVE   Squamous Epithelial / HPF 0-5 0 - 5 /HPF   WBC, UA 21-50 0 - 5 WBC/hpf   RBC / HPF NONE SEEN 0 - 5 RBC/hpf   Bacteria, UA FEW (A) NONE SEEN   WBC Clumps PRESENT    Granular Casts, UA PRESENT    Urine-Other MICROSCOPIC EXAM PERFORMED ON UNCONCENTRATED URINE   CBC with Differential     Status: Abnormal (Preliminary result)   Collection Time: 06/23/23  3:08 PM  Result Value Ref Range   WBC 11.4 (H) 4.0 - 10.5 K/uL   RBC 5.15 (H) 3.87 - 5.11 MIL/uL   Hemoglobin 14.8 12.0 - 15.0 g/dL   HCT 54.0 (H) 98.1 - 19.1 %   MCV 90.7 80.0 - 100.0 fL   MCH 28.7 26.0 - 34.0 pg   MCHC 31.7 30.0 - 36.0 g/dL   RDW 47.8 29.5 - 62.1 %   Platelets 275 150 - 400 K/uL   nRBC 0.0 0.0 - 0.2 %   Neutrophils Relative % PENDING %   Neutro Abs PENDING 1.7 - 7.7 K/uL   Band Neutrophils PENDING %   Lymphocytes Relative PENDING %   Lymphs Abs PENDING 0.7 - 4.0 K/uL   Monocytes Relative  PENDING %   Monocytes Absolute PENDING 0.1 - 1.0 K/uL   Eosinophils Relative PENDING %   Eosinophils Absolute PENDING 0.0 - 0.5 K/uL   Basophils Relative PENDING %   Basophils Absolute PENDING 0.0 - 0.1 K/uL   WBC Morphology PENDING    RBC Morphology PENDING  Smear Review PENDING    Other PENDING %   nRBC PENDING 0 /100 WBC   Metamyelocytes Relative PENDING %   Myelocytes PENDING %   Promyelocytes Relative PENDING %   Blasts PENDING %   Immature Granulocytes PENDING %   Abs Immature Granulocytes PENDING 0.00 - 0.07 K/uL  Comprehensive metabolic panel     Status: Abnormal   Collection Time: 06/23/23  3:08 PM  Result Value Ref Range   Sodium 134 (L) 135 - 145 mmol/L   Potassium 4.5 3.5 - 5.1 mmol/L   Chloride 100 98 - 111 mmol/L   CO2 25 22 - 32 mmol/L   Glucose, Bld 104 (H) 70 - 99 mg/dL   BUN 25 (H) 8 - 23 mg/dL   Creatinine, Ser 1.61 0.44 - 1.00 mg/dL   Calcium 9.0 8.9 - 09.6 mg/dL   Total Protein 7.0 6.5 - 8.1 g/dL   Albumin 4.0 3.5 - 5.0 g/dL   AST 27 15 - 41 U/L   ALT 23 0 - 44 U/L   Alkaline Phosphatase 114 38 - 126 U/L   Total Bilirubin 0.4 0.0 - 1.2 mg/dL   GFR, Estimated >04 >54 mL/min   Anion gap 9 5 - 15   No results found.  ED Clinical Impression  1. Complicated urinary tract infection      ED Assessment/Plan    Patient presents with acute illness with systemic symptoms of tachycardia.  Urine concentrated with glucose, large bilirubin, ketones, protein, positive nitrite, esterase, pyuria, and a few bacteria.  Sending this off for culture.  Checking CBC, CMP due to the bilirubin, proteinuria and glucosuria.  Doubt nephrolithiasis as patient denies abdominal, flank, back pain, and has no history of nephrolithiasis.  If labs are unremarkable, will treat as a complicated UTI with Bactrim  DS p.o. twice daily for 10 days.  Push fluids.  Follow-up with PCP to make sure urine normalizes after finishing the antibiotics.  ER return precautions given the patient  family member.  BUN slightly elevated, but creatinine normal, liver enzymes, CMP unremarkable.  She has a mild leukocytosis at 11.4, but no left shift as of the time of this dictation.  Will send home with Bactrim  as she reports anaphylaxis to penicillin and does not want to try Omnicef.  Pyridium for symptom management.  Plan as above.  Discussed labs,  MDM, treatment plan, and plan for follow-up with patient. Discussed sn/sx that should prompt return to the ED. patient agrees with plan.   Meds ordered this encounter  Medications   sulfamethoxazole -trimethoprim  (BACTRIM  DS) 800-160 MG tablet    Sig: Take 1 tablet by mouth 2 (two) times daily for 10 days.    Dispense:  20 tablet    Refill:  0   phenazopyridine (PYRIDIUM) 200 MG tablet    Sig: Take 1 tablet (200 mg total) by mouth 3 (three) times daily as needed for pain.    Dispense:  6 tablet    Refill:  0      *This clinic note was created using Scientist, clinical (histocompatibility and immunogenetics). Therefore, there may be occasional mistakes despite careful proofreading.  ?    Ethlyn Herd, MD 06/24/23 [phone]

## 2023-06-23 NOTE — ED Triage Notes (Signed)
 Patient c/o dysuria that started on Friday.

## 2023-06-24 LAB — URINE CULTURE

## 2023-09-10 LAB — COLOGUARD: COLOGUARD: NEGATIVE

## 2024-01-21 ENCOUNTER — Other Ambulatory Visit: Payer: Self-pay | Admitting: Family Medicine

## 2024-01-21 DIAGNOSIS — Z1231 Encounter for screening mammogram for malignant neoplasm of breast: Secondary | ICD-10-CM

## 2024-01-27 ENCOUNTER — Other Ambulatory Visit: Payer: Self-pay | Admitting: Family Medicine

## 2024-01-27 DIAGNOSIS — Z780 Asymptomatic menopausal state: Secondary | ICD-10-CM

## 2024-03-03 ENCOUNTER — Ambulatory Visit

## 2024-03-09 ENCOUNTER — Ambulatory Visit
